# Patient Record
Sex: Female | Born: 1975
Health system: Southern US, Academic
[De-identification: ages and names within clinical notes are randomized; demographics above are authoritative.]

## PROBLEM LIST (undated history)

## (undated) DIAGNOSIS — Z227 Latent tuberculosis: Secondary | ICD-10-CM

## (undated) DIAGNOSIS — M1990 Unspecified osteoarthritis, unspecified site: Secondary | ICD-10-CM

## (undated) DIAGNOSIS — K8020 Calculus of gallbladder without cholecystitis without obstruction: Secondary | ICD-10-CM

## (undated) DIAGNOSIS — Z8489 Family history of other specified conditions: Secondary | ICD-10-CM

## (undated) DIAGNOSIS — D649 Anemia, unspecified: Secondary | ICD-10-CM

## (undated) DIAGNOSIS — R011 Cardiac murmur, unspecified: Secondary | ICD-10-CM

## (undated) DIAGNOSIS — K759 Inflammatory liver disease, unspecified: Secondary | ICD-10-CM

## (undated) HISTORY — PX: TUBAL LIGATION: SHX77

---

## 2016-02-09 ENCOUNTER — Other Ambulatory Visit: Payer: Self-pay | Admitting: Nurse Practitioner

## 2016-02-09 DIAGNOSIS — Z1231 Encounter for screening mammogram for malignant neoplasm of breast: Secondary | ICD-10-CM

## 2016-02-12 ENCOUNTER — Ambulatory Visit: Payer: Self-pay

## 2016-03-23 ENCOUNTER — Other Ambulatory Visit: Payer: Self-pay | Admitting: Gastroenterology

## 2016-03-23 DIAGNOSIS — B181 Chronic viral hepatitis B without delta-agent: Secondary | ICD-10-CM

## 2016-03-23 DIAGNOSIS — D696 Thrombocytopenia, unspecified: Secondary | ICD-10-CM

## 2016-04-04 ENCOUNTER — Ambulatory Visit
Admission: RE | Admit: 2016-04-04 | Discharge: 2016-04-04 | Disposition: A | Discharge status: Home / Self Care | Payer: Medicaid Other | Source: Ambulatory Visit | Attending: Gastroenterology | Admitting: Gastroenterology

## 2016-04-04 DIAGNOSIS — D696 Thrombocytopenia, unspecified: Secondary | ICD-10-CM

## 2016-04-04 DIAGNOSIS — B181 Chronic viral hepatitis B without delta-agent: Secondary | ICD-10-CM

## 2016-05-10 ENCOUNTER — Ambulatory Visit
Admission: RE | Admit: 2016-05-10 | Discharge: 2016-05-10 | Disposition: A | Discharge status: Home / Self Care | Payer: Commercial Managed Care - PPO | Source: Ambulatory Visit | Attending: Internal Medicine | Admitting: Internal Medicine

## 2016-05-10 ENCOUNTER — Encounter: Payer: Self-pay | Admitting: Internal Medicine

## 2016-05-10 ENCOUNTER — Other Ambulatory Visit: Payer: Self-pay | Admitting: Internal Medicine

## 2016-05-10 ENCOUNTER — Ambulatory Visit (INDEPENDENT_AMBULATORY_CARE_PROVIDER_SITE_OTHER): Payer: Commercial Managed Care - PPO | Admitting: Internal Medicine

## 2016-05-10 DIAGNOSIS — B181 Chronic viral hepatitis B without delta-agent: Secondary | ICD-10-CM

## 2016-05-10 DIAGNOSIS — Z227 Latent tuberculosis: Secondary | ICD-10-CM

## 2016-05-10 DIAGNOSIS — J302 Other seasonal allergic rhinitis: Secondary | ICD-10-CM | POA: Insufficient documentation

## 2016-05-10 DIAGNOSIS — D509 Iron deficiency anemia, unspecified: Secondary | ICD-10-CM | POA: Insufficient documentation

## 2016-05-10 DIAGNOSIS — J301 Allergic rhinitis due to pollen: Secondary | ICD-10-CM

## 2016-05-10 DIAGNOSIS — D696 Thrombocytopenia, unspecified: Secondary | ICD-10-CM

## 2016-05-10 DIAGNOSIS — R7611 Nonspecific reaction to tuberculin skin test without active tuberculosis: Secondary | ICD-10-CM

## 2016-05-10 LAB — COMPREHENSIVE METABOLIC PANEL
ALT: 7 U/L (ref 6–29)
AST: 15 U/L (ref 10–30)
Albumin: 4.2 g/dL (ref 3.6–5.1)
Alkaline Phosphatase: 78 U/L (ref 33–115)
BILIRUBIN TOTAL: 0.3 mg/dL (ref 0.2–1.2)
BUN: 12 mg/dL (ref 7–25)
CALCIUM: 9.1 mg/dL (ref 8.6–10.2)
CHLORIDE: 105 mmol/L (ref 98–110)
CO2: 26 mmol/L (ref 20–31)
Creat: 0.96 mg/dL (ref 0.50–1.10)
GLUCOSE: 78 mg/dL (ref 65–99)
POTASSIUM: 4.5 mmol/L (ref 3.5–5.3)
Sodium: 138 mmol/L (ref 135–146)
Total Protein: 7.1 g/dL (ref 6.1–8.1)

## 2016-05-10 MED ORDER — RIFAMPIN 300 MG PO CAPS: 600.0000 mg | ORAL_CAPSULE | Freq: Every day | ORAL | 3 refills | Status: DC

## 2016-05-10 NOTE — Assessment & Plan Note (Signed)
I will check hepatitis B serologies, fibrosure and hepatitis B DNA viral load to make sure Viread is suppressing her infection.

## 2016-05-10 NOTE — Assessment & Plan Note (Signed)
I talked to her about the lifetime benefit of taking rifampin to reduce her risk of developing active tuberculosis. I also reviewed potential side effects including elevation of liver enzymes, discoloration of urine and tears and potential drug drug interactions. After this discussion she is willing to start rifampin. I favor it over isoniazid in this case because of the shorter duration of therapy. I will get a repeat chest x-ray today, baseline liver enzymes and HIV antibody. She will follow-up in one month.

## 2016-05-10 NOTE — Progress Notes (Signed)
[facility]  Reason for Consult: Latent tuberculosis and chronic hepatitis B Referring Physician: Dr. Kathi DerParag Brahmbhatt  Patient Active Problem List   Diagnosis Date Noted  . Chronic hepatitis B without delta agent without cirrhosis (HCC) 05/10/2016    Priority: High  . Latent tuberculosis by blood test 05/10/2016    Priority: High  . Seasonal allergies 05/10/2016  . Microcytic anemia 05/10/2016  . Thrombocytopenia (HCC) 05/10/2016    Patient's Medications  New Prescriptions   RIFAMPIN (RIFADIN) 300 MG CAPSULE    Take 2 capsules (600 mg total) by mouth daily.  Previous Medications   FERROUS SULFATE 325 (65 FE) MG EC TABLET    Take 325 mg by mouth 3 (three) times daily with meals.   TENOFOVIR (VIREAD) 300 MG TABLET    Take 300 mg by mouth daily.  Modified Medications   No medications on file  Discontinued Medications   No medications on file    Recommendations: 1. Chest x-ray 2. CMP, hepatitis B surface antigen, surface antibody, E antigen, E antibody, DNA viral load, fibrosure and HIV antibody 3. Start rifampin 600 mg daily for 4 months 4. Follow-up in 4 weeks  Assessment: I talked to her about the lifetime benefit of taking rifampin to reduce her risk of developing active tuberculosis. I also reviewed potential side effects including elevation of liver enzymes, discoloration of urine and tears and potential drug drug interactions. After this discussion she is willing to start rifampin. I favor it over isoniazid in this case because of the shorter duration of therapy. I will get a repeat chest x-ray today, baseline liver enzymes and HIV antibody. She will follow-up in one month.  I will check hepatitis B serologies, fibrosure and hepatitis B DNA viral load to make sure Viread is suppressing her infection.   HPI: Christine Mccarty is a 40 y.o. female was originally from [place]. While living in [place] about 8 years ago she developed  thrombocytopenia and elevated liver enzymes and was found to have chronic hepatitis B. She has been on Viread ever since she believes her infection has been under good control. In 2014 she was found to have latent tuberculosis with a QuantiFERON TB gold assay. She recalls being told that her chest x-ray was normal at that time. Her doctor [place] recommended rifampin but she never started it because she was concerned about liver inflammation. She says that she is willing to take rifampin if I recommend it.  Review of Systems: Review of Systems  Constitutional: Negative for chills, diaphoresis, fever, malaise/fatigue and weight loss.  HENT: Negative for sore throat.   Respiratory: Negative for cough, hemoptysis, sputum production and shortness of breath.   Cardiovascular: Negative for chest pain.  Gastrointestinal: Negative for abdominal pain, diarrhea, heartburn, nausea and vomiting.  Genitourinary: Negative for dysuria and frequency.  Musculoskeletal: Positive for joint pain. Negative for myalgias.  Skin: Negative for rash.  Neurological: Negative for dizziness and headaches.  Psychiatric/Behavioral: Negative for depression and substance abuse. The patient is not nervous/anxious.       No past medical history on file.  Social History  Substance Use Topics  . Smoking status: Never Smoker  . Smokeless tobacco: Never Used  . Alcohol use 0.6 oz/week    1 Standard drinks or equivalent per week     Comment: rare social drinker    No family history on file. Not on File  OBJECTIVE: Vitals:   05/10/16  0916  BP: 121/77  Pulse: 65  Temp: 98.1 F (36.7 C)  TempSrc: Oral  Weight: 199 lb 4 oz (90.4 kg)  Height: 5' 7.5" (1.715 m)   Body mass index is 30.75 kg/m.   Physical Exam  Constitutional: She is oriented to person, place, and time.  She is in good spirits and in no distress.  HENT:  Mouth/Throat: No oropharyngeal exudate.  Eyes: Conjunctivae are normal.    Cardiovascular: Normal rate and regular rhythm.   No murmur heard. Pulmonary/Chest: Effort normal and breath sounds normal. She has no wheezes. She has no rales.  Abdominal: Soft. She exhibits no mass. There is no tenderness.  Musculoskeletal: Normal range of motion.  Neurological: She is alert and oriented to person, place, and time. Gait normal.  Skin: No rash noted.  Psychiatric: Mood and affect normal.    Microbiology: No results found for this or any previous visit (from the past 240 hour(s)).  Cliffton AstersJohn Idona Stach, MD [facility] [phone] pager   [phone] cell 05/10/2016, 9:37 AM

## 2016-05-11 LAB — HEPATITIS C ANTIBODY: HCV AB: NEGATIVE

## 2016-05-11 LAB — HEPATITIS B SURF AG CONFIRMATION: HEPATITIS B SURFACE ANTIGEN CONFIRMATION: POSITIVE — AB

## 2016-05-11 LAB — HEPATITIS B SURFACE ANTIGEN: Hepatitis B Surface Ag: POSITIVE — AB

## 2016-05-11 LAB — HEPATITIS A ANTIBODY, TOTAL: HEP A TOTAL AB: REACTIVE — AB

## 2016-05-11 LAB — HEPATITIS B SURFACE ANTIBODY,QUALITATIVE: Hep B S Ab: NEGATIVE

## 2016-05-11 NOTE — Addendum Note (Signed)
Addended byJimmy Picket: ABBITT, KATRINA F on: 05/11/2016 03:17 PM   Modules accepted: Orders

## 2016-05-13 ENCOUNTER — Telehealth: Payer: Self-pay | Admitting: *Deleted

## 2016-05-13 LAB — HEPATITIS B E ANTIBODY: Hepatitis Be Antibody: REACTIVE — AB

## 2016-05-13 LAB — HEPATITIS B E ANTIGEN: HEPATITIS BE ANTIGEN: NONREACTIVE

## 2016-05-13 NOTE — Telephone Encounter (Signed)
Dr Orvan Falconerampbell requesting pt return for labwork-Hep B VL.  This order was added by Dr. Orvan Falconerampbell and the patient needs to return at her earliest convenience to have it drawn.  Asked the patient to call back on [phone] to make this  appointment.

## 2016-05-14 LAB — LIVER FIBROSIS, FIBROTEST-ACTITEST
ALPHA-2-MACROGLOBULIN: 232 mg/dL (ref 106–279)
ALT: 7 U/L (ref 6–29)
Apolipoprotein A1: 162 mg/dL (ref 101–198)
Bilirubin: 0.3 mg/dL (ref 0.2–1.2)
Fibrosis Score: 0.09
GGT: 15 U/L (ref 3–55)
Haptoglobin: 112 mg/dL (ref 43–212)
NECROINFLAMMAT ACT SCORE: 0.01
Reference ID: 1719671

## 2016-05-23 ENCOUNTER — Other Ambulatory Visit: Payer: Commercial Managed Care - PPO

## 2016-05-23 DIAGNOSIS — Z227 Latent tuberculosis: Secondary | ICD-10-CM

## 2016-05-25 LAB — HEPATITIS B DNA, ULTRAQUANTITATIVE, PCR

## 2016-05-31 NOTE — Progress Notes (Signed)
Patient does not require isolation precautions related to diagnosis of latent TB. Fredric DineMargaret Kendrea Cerritos RN BSN Infection Prevention

## 2016-06-01 ENCOUNTER — Encounter (HOSPITAL_COMMUNITY): Payer: Self-pay | Admitting: *Deleted

## 2016-06-01 ENCOUNTER — Ambulatory Visit: Payer: Self-pay | Admitting: General Surgery

## 2016-06-01 NOTE — Progress Notes (Signed)
Pt denies SOB, chest pain, and being under the care of a cardiologist. Pt stated that an echo was performed >10 years ago when it was detected that she had a mild heart murmur. Pt denies having a stress test and cardiac cath. Pt last labs were done 05/10/16. Pt made aware to stop taking Aspirin,vitamins, fish oil, and herbal medications. Do not take any NSAIDs ie: Ibuprofen, Advil, Naproxen, BC and Goody Powder or any medication containing Aspirin. Pt verbalized understanding of all pre-op instructions.

## 2016-06-02 ENCOUNTER — Ambulatory Visit: Payer: Self-pay | Admitting: General Surgery

## 2016-06-02 ENCOUNTER — Ambulatory Visit (HOSPITAL_COMMUNITY): Payer: Commercial Managed Care - PPO | Admitting: Anesthesiology

## 2016-06-02 ENCOUNTER — Encounter (HOSPITAL_COMMUNITY): Payer: Self-pay | Admitting: *Deleted

## 2016-06-02 ENCOUNTER — Ambulatory Visit (HOSPITAL_COMMUNITY)
Admission: RE | Admit: 2016-06-02 | Discharge: 2016-06-02 | Disposition: A | Discharge status: Home / Self Care | Payer: Commercial Managed Care - PPO | Source: Ambulatory Visit | Attending: General Surgery | Admitting: General Surgery

## 2016-06-02 ENCOUNTER — Encounter (HOSPITAL_COMMUNITY)
Admission: RE | Disposition: A | Discharge status: Home / Self Care | Payer: Self-pay | Source: Ambulatory Visit | Attending: General Surgery

## 2016-06-02 DIAGNOSIS — K824 Cholesterolosis of gallbladder: Secondary | ICD-10-CM | POA: Diagnosis present

## 2016-06-02 DIAGNOSIS — E7800 Pure hypercholesterolemia, unspecified: Secondary | ICD-10-CM | POA: Insufficient documentation

## 2016-06-02 DIAGNOSIS — B1910 Unspecified viral hepatitis B without hepatic coma: Secondary | ICD-10-CM | POA: Insufficient documentation

## 2016-06-02 DIAGNOSIS — K8010 Calculus of gallbladder with chronic cholecystitis without obstruction: Secondary | ICD-10-CM | POA: Insufficient documentation

## 2016-06-02 DIAGNOSIS — Z79899 Other long term (current) drug therapy: Secondary | ICD-10-CM | POA: Diagnosis not present

## 2016-06-02 HISTORY — DX: Cardiac murmur, unspecified: R01.1

## 2016-06-02 HISTORY — DX: Calculus of gallbladder without cholecystitis without obstruction: K80.20

## 2016-06-02 HISTORY — DX: Latent tuberculosis: Z22.7

## 2016-06-02 HISTORY — DX: Inflammatory liver disease, unspecified: K75.9

## 2016-06-02 HISTORY — DX: Family history of other specified conditions: Z84.89

## 2016-06-02 HISTORY — PX: CHOLECYSTECTOMY: SHX55

## 2016-06-02 HISTORY — DX: Unspecified osteoarthritis, unspecified site: M19.90

## 2016-06-02 HISTORY — DX: Anemia, unspecified: D64.9

## 2016-06-02 LAB — CBC
HCT: 37.4 % (ref 36.0–46.0)
HEMOGLOBIN: 11.9 g/dL — AB (ref 12.0–15.0)
MCH: 26.4 pg (ref 26.0–34.0)
MCHC: 31.8 g/dL (ref 30.0–36.0)
MCV: 82.9 fL (ref 78.0–100.0)
Platelets: 108 10*3/uL — ABNORMAL LOW (ref 150–400)
RBC: 4.51 MIL/uL (ref 3.87–5.11)
RDW: 16.4 % — ABNORMAL HIGH (ref 11.5–15.5)
WBC: 3.8 10*3/uL — ABNORMAL LOW (ref 4.0–10.5)

## 2016-06-02 LAB — COMPREHENSIVE METABOLIC PANEL
ALBUMIN: 4 g/dL (ref 3.5–5.0)
ALK PHOS: 90 U/L (ref 38–126)
ALT: 10 U/L — AB (ref 14–54)
ANION GAP: 4 — AB (ref 5–15)
AST: 18 U/L (ref 15–41)
BUN: 6 mg/dL (ref 6–20)
CALCIUM: 9.1 mg/dL (ref 8.9–10.3)
CHLORIDE: 108 mmol/L (ref 101–111)
CO2: 26 mmol/L (ref 22–32)
Creatinine, Ser: 0.96 mg/dL (ref 0.44–1.00)
GFR calc non Af Amer: >60 mL/min (ref >60)
GLUCOSE: 86 mg/dL (ref 65–99)
Potassium: 4.3 mmol/L (ref 3.5–5.1)
SODIUM: 138 mmol/L (ref 135–145)
Total Bilirubin: 0.3 mg/dL (ref 0.3–1.2)
Total Protein: 6.9 g/dL (ref 6.5–8.1)

## 2016-06-02 LAB — HCG, SERUM, QUALITATIVE: Preg, Serum: NEGATIVE

## 2016-06-02 SURGERY — LAPAROSCOPIC CHOLECYSTECTOMY
Anesthesia: General | Site: Abdomen

## 2016-06-02 MED ORDER — DIPHENHYDRAMINE HCL 50 MG/ML IJ SOLN
12.5000 mg | Freq: Once | INTRAMUSCULAR | Status: AC
  Administered 2016-06-02: 12.5 mg via INTRAVENOUS

## 2016-06-02 MED ORDER — GLYCOPYRROLATE 0.2 MG/ML IJ SOLN
INTRAMUSCULAR | Status: DC | PRN
  Administered 2016-06-02: 0.6 mg via INTRAVENOUS

## 2016-06-02 MED ORDER — DEXAMETHASONE SODIUM PHOSPHATE 10 MG/ML IJ SOLN
INTRAMUSCULAR | Status: AC
  Filled 2016-06-02: qty 1

## 2016-06-02 MED ORDER — NEOSTIGMINE METHYLSULFATE 5 MG/5ML IV SOSY
PREFILLED_SYRINGE | INTRAVENOUS | Status: AC
  Filled 2016-06-02: qty 5

## 2016-06-02 MED ORDER — SODIUM CHLORIDE 0.9 % IV SOLN: 250.0000 mL | INTRAVENOUS | Status: DC | PRN

## 2016-06-02 MED ORDER — CHLORHEXIDINE GLUCONATE CLOTH 2 % EX PADS: 6.0000 | MEDICATED_PAD | Freq: Once | CUTANEOUS | Status: DC

## 2016-06-02 MED ORDER — DIPHENHYDRAMINE HCL 50 MG/ML IJ SOLN
INTRAMUSCULAR | Status: AC
  Filled 2016-06-02: qty 1

## 2016-06-02 MED ORDER — CEFAZOLIN SODIUM-DEXTROSE 2-4 GM/100ML-% IV SOLN
2.0000 g | INTRAVENOUS | Status: AC
  Administered 2016-06-02: 2 g via INTRAVENOUS
  Filled 2016-06-02: qty 100

## 2016-06-02 MED ORDER — ROCURONIUM BROMIDE 50 MG/5ML IV SOSY
PREFILLED_SYRINGE | INTRAVENOUS | Status: AC
  Filled 2016-06-02: qty 5

## 2016-06-02 MED ORDER — LIDOCAINE HCL (CARDIAC) 20 MG/ML IV SOLN
INTRAVENOUS | Status: DC | PRN
  Administered 2016-06-02: 60 mg via INTRAVENOUS

## 2016-06-02 MED ORDER — BUPIVACAINE-EPINEPHRINE (PF) 0.25% -1:200000 IJ SOLN
INTRAMUSCULAR | Status: DC | PRN
  Administered 2016-06-02: 30 mL via PERINEURAL

## 2016-06-02 MED ORDER — FENTANYL CITRATE (PF) 100 MCG/2ML IJ SOLN
25.0000 ug | INTRAMUSCULAR | Status: DC | PRN
  Administered 2016-06-02: 50 ug via INTRAVENOUS

## 2016-06-02 MED ORDER — ONDANSETRON HCL 4 MG/2ML IJ SOLN: 4.0000 mg | Freq: Once | INTRAMUSCULAR | Status: DC | PRN

## 2016-06-02 MED ORDER — MIDAZOLAM HCL 5 MG/5ML IJ SOLN
INTRAMUSCULAR | Status: DC | PRN
  Administered 2016-06-02: 2 mg via INTRAVENOUS

## 2016-06-02 MED ORDER — ONDANSETRON HCL 4 MG/2ML IJ SOLN
INTRAMUSCULAR | Status: DC | PRN
  Administered 2016-06-02: 4 mg via INTRAVENOUS

## 2016-06-02 MED ORDER — SODIUM CHLORIDE 0.9% FLUSH: 3.0000 mL | INTRAVENOUS | Status: DC | PRN

## 2016-06-02 MED ORDER — OXYCODONE-ACETAMINOPHEN 5-325 MG PO TABS: 1.0000 | ORAL_TABLET | ORAL | 0 refills | Status: DC | PRN

## 2016-06-02 MED ORDER — PROPOFOL 10 MG/ML IV BOLUS
INTRAVENOUS | Status: DC | PRN
  Administered 2016-06-02: 140 mg via INTRAVENOUS
  Administered 2016-06-02: 20 mg via INTRAVENOUS

## 2016-06-02 MED ORDER — NEOSTIGMINE METHYLSULFATE 10 MG/10ML IV SOLN
INTRAVENOUS | Status: DC | PRN
  Administered 2016-06-02: 4 mg via INTRAVENOUS

## 2016-06-02 MED ORDER — LACTATED RINGERS IV SOLN
INTRAVENOUS | Status: DC
  Administered 2016-06-02 (×2): via INTRAVENOUS

## 2016-06-02 MED ORDER — FENTANYL CITRATE (PF) 250 MCG/5ML IJ SOLN
INTRAMUSCULAR | Status: AC
  Filled 2016-06-02: qty 5

## 2016-06-02 MED ORDER — SODIUM CHLORIDE 0.9% FLUSH: 3.0000 mL | Freq: Two times a day (BID) | INTRAVENOUS | Status: DC

## 2016-06-02 MED ORDER — ROCURONIUM BROMIDE 100 MG/10ML IV SOLN
INTRAVENOUS | Status: DC | PRN
  Administered 2016-06-02: 40 mg via INTRAVENOUS

## 2016-06-02 MED ORDER — PROPOFOL 10 MG/ML IV BOLUS
INTRAVENOUS | Status: AC
  Filled 2016-06-02: qty 20

## 2016-06-02 MED ORDER — FENTANYL CITRATE (PF) 100 MCG/2ML IJ SOLN
INTRAMUSCULAR | Status: DC | PRN
  Administered 2016-06-02: 100 ug via INTRAVENOUS
  Administered 2016-06-02: 50 ug via INTRAVENOUS
  Administered 2016-06-02: 100 ug via INTRAVENOUS

## 2016-06-02 MED ORDER — OXYCODONE HCL 5 MG PO TABS: 5.0000 mg | ORAL_TABLET | Freq: Once | ORAL | Status: DC | PRN

## 2016-06-02 MED ORDER — MORPHINE SULFATE (PF) 4 MG/ML IV SOLN
INTRAVENOUS | Status: AC
  Filled 2016-06-02: qty 1

## 2016-06-02 MED ORDER — MORPHINE SULFATE (PF) 2 MG/ML IV SOLN
2.0000 mg | INTRAVENOUS | Status: DC | PRN
  Administered 2016-06-02: 2 mg via INTRAVENOUS

## 2016-06-02 MED ORDER — DEXAMETHASONE SODIUM PHOSPHATE 10 MG/ML IJ SOLN
INTRAMUSCULAR | Status: DC | PRN
  Administered 2016-06-02: 10 mg via INTRAVENOUS

## 2016-06-02 MED ORDER — FENTANYL CITRATE (PF) 100 MCG/2ML IJ SOLN
INTRAMUSCULAR | Status: AC
  Administered 2016-06-02: 50 ug via INTRAVENOUS
  Filled 2016-06-02: qty 2

## 2016-06-02 MED ORDER — 0.9 % SODIUM CHLORIDE (POUR BTL) OPTIME
TOPICAL | Status: DC | PRN
  Administered 2016-06-02: 1000 mL

## 2016-06-02 MED ORDER — ONDANSETRON HCL 4 MG/2ML IJ SOLN
INTRAMUSCULAR | Status: AC
  Filled 2016-06-02: qty 4

## 2016-06-02 MED ORDER — OXYCODONE HCL 5 MG PO TABS
ORAL_TABLET | ORAL | Status: AC
  Administered 2016-06-02: 10 mg via ORAL
  Filled 2016-06-02: qty 2

## 2016-06-02 MED ORDER — ACETAMINOPHEN 650 MG RE SUPP: 650.0000 mg | RECTAL | Status: DC | PRN

## 2016-06-02 MED ORDER — OXYCODONE HCL 5 MG/5ML PO SOLN: 5.0000 mg | Freq: Once | ORAL | Status: DC | PRN

## 2016-06-02 MED ORDER — ACETAMINOPHEN 325 MG PO TABS
ORAL_TABLET | ORAL | Status: AC
  Administered 2016-06-02: 650 mg via ORAL
  Filled 2016-06-02: qty 2

## 2016-06-02 MED ORDER — SODIUM CHLORIDE 0.9 % IR SOLN
Status: DC | PRN
  Administered 2016-06-02: 1000 mL

## 2016-06-02 MED ORDER — ACETAMINOPHEN 325 MG PO TABS
650.0000 mg | ORAL_TABLET | ORAL | Status: DC | PRN
  Administered 2016-06-02: 650 mg via ORAL

## 2016-06-02 MED ORDER — BUPIVACAINE-EPINEPHRINE (PF) 0.25% -1:200000 IJ SOLN
INTRAMUSCULAR | Status: AC
  Filled 2016-06-02: qty 30

## 2016-06-02 MED ORDER — OXYCODONE HCL 5 MG PO TABS
5.0000 mg | ORAL_TABLET | ORAL | Status: DC | PRN
  Administered 2016-06-02: 10 mg via ORAL

## 2016-06-02 MED ORDER — LIDOCAINE 2% (20 MG/ML) 5 ML SYRINGE
INTRAMUSCULAR | Status: AC
  Filled 2016-06-02: qty 5

## 2016-06-02 MED ORDER — MIDAZOLAM HCL 2 MG/2ML IJ SOLN
INTRAMUSCULAR | Status: AC
  Filled 2016-06-02: qty 2

## 2016-06-02 SURGICAL SUPPLY — 39 items
BENZOIN TINCTURE PRP APPL 2/3 (GAUZE/BANDAGES/DRESSINGS) ×3 IMPLANT
CANISTER SUCTION 2500CC (MISCELLANEOUS) ×3 IMPLANT
CHLORAPREP W/TINT 26ML (MISCELLANEOUS) ×3 IMPLANT
CLIP LIGATING HEMO O LOK GREEN (MISCELLANEOUS) ×3 IMPLANT
CLOSURE STERI-STRIP 1/2X4 (GAUZE/BANDAGES/DRESSINGS) ×1
CLOSURE WOUND 1/2 X4 (GAUZE/BANDAGES/DRESSINGS) ×1
CLSR STERI-STRIP ANTIMIC 1/2X4 (GAUZE/BANDAGES/DRESSINGS) ×2 IMPLANT
COVER SURGICAL LIGHT HANDLE (MISCELLANEOUS) ×3 IMPLANT
COVER TRANSDUCER ULTRASND (DRAPES) ×3 IMPLANT
DEVICE TROCAR PUNCTURE CLOSURE (ENDOMECHANICALS) ×3 IMPLANT
ELECT REM PT RETURN 9FT ADLT (ELECTROSURGICAL) ×3
ELECTRODE REM PT RTRN 9FT ADLT (ELECTROSURGICAL) ×1 IMPLANT
GAUZE SPONGE 2X2 8PLY STRL LF (GAUZE/BANDAGES/DRESSINGS) ×1 IMPLANT
GLOVE BIO SURGEON STRL SZ7.5 (GLOVE) ×3 IMPLANT
GOWN STRL REUS W/ TWL LRG LVL3 (GOWN DISPOSABLE) ×2 IMPLANT
GOWN STRL REUS W/ TWL XL LVL3 (GOWN DISPOSABLE) ×1 IMPLANT
GOWN STRL REUS W/TWL LRG LVL3 (GOWN DISPOSABLE) ×4
GOWN STRL REUS W/TWL XL LVL3 (GOWN DISPOSABLE) ×2
KIT BASIN OR (CUSTOM PROCEDURE TRAY) ×3 IMPLANT
KIT ROOM TURNOVER OR (KITS) ×3 IMPLANT
NEEDLE INSUFFLATION 14GA 120MM (NEEDLE) ×3 IMPLANT
NS IRRIG 1000ML POUR BTL (IV SOLUTION) ×3 IMPLANT
PAD ARMBOARD 7.5X6 YLW CONV (MISCELLANEOUS) ×6 IMPLANT
POUCH RETRIEVAL ECOSAC 10 (ENDOMECHANICALS) IMPLANT
POUCH RETRIEVAL ECOSAC 10MM (ENDOMECHANICALS)
SCISSORS LAP 5X35 DISP (ENDOMECHANICALS) ×3 IMPLANT
SET IRRIG TUBING LAPAROSCOPIC (IRRIGATION / IRRIGATOR) ×3 IMPLANT
SLEEVE ENDOPATH XCEL 5M (ENDOMECHANICALS) ×6 IMPLANT
SPECIMEN JAR SMALL (MISCELLANEOUS) ×3 IMPLANT
SPONGE GAUZE 2X2 STER 10/PKG (GAUZE/BANDAGES/DRESSINGS) ×2
STRIP CLOSURE SKIN 1/2X4 (GAUZE/BANDAGES/DRESSINGS) ×2 IMPLANT
SUT MNCRL AB 3-0 PS2 18 (SUTURE) ×3 IMPLANT
TAPE CLOTH SURG 4X10 WHT LF (GAUZE/BANDAGES/DRESSINGS) ×3 IMPLANT
TOWEL OR 17X24 6PK STRL BLUE (TOWEL DISPOSABLE) ×3 IMPLANT
TOWEL OR 17X26 10 PK STRL BLUE (TOWEL DISPOSABLE) ×3 IMPLANT
TRAY LAPAROSCOPIC MC (CUSTOM PROCEDURE TRAY) ×3 IMPLANT
TROCAR XCEL NON-BLD 11X100MML (ENDOMECHANICALS) ×3 IMPLANT
TROCAR XCEL NON-BLD 5MMX100MML (ENDOMECHANICALS) ×3 IMPLANT
TUBING INSUFFLATION (TUBING) ×3 IMPLANT

## 2016-06-02 NOTE — H&P (Signed)
History of Present Illness Patient words: GB.  The patient is a 40 year old female who presents for evaluation of gall stones. The patient is a 40 year old female who is referred by Dr. Kathi DerParag Brahmbhatt for evaluation of gallstones and a gallbladder polyp. Patient has a history of hepatitis B who is reestablishing care here after moving from [place]. Patient states that she is currently on medication and has an undetectable level of hepatitis B virus. Patient states that she underwent surveillance ultrasound which revealed multiple gallstones as well as a 7 mm gallbladder polyp. Patient was referred thereafter for cholecystectomy.   Other Problems  Arthritis  Cholelithiasis  Heart murmur  Hepatitis  Hypercholesterolemia   Diagnostic Studies History  Colonoscopy  never Mammogram  never Pap Smear  1-5 years ago  Allergies NSAIDs   Medication History  Iron (Ferrous Gluconate) (325MG  Tablet, Oral) Active. Viread (150MG  Tablet, Oral) Active. Medications Reconciled  Social History  Alcohol use  Occasional alcohol use. Caffeine use  Carbonated beverages, Coffee, Tea. No drug use  Tobacco use  Never smoker.  Family History  Diabetes Mellitus  Mother. Hypertension  Mother.  Pregnancy / Birth History Age at menarche  10 years. Gravida  5 Length (months) of breastfeeding  7-12 Maternal age  42-25 Para  4 Regular periods     Review of Systems  General Not Present- Appetite Loss, Chills, Fatigue, Fever, Night Sweats, Weight Gain and Weight Loss. Skin Not Present- Change in Wart/Mole, Dryness, Hives, Jaundice, New Lesions, Non-Healing Wounds, Rash and Ulcer. HEENT Present- Seasonal Allergies and Wears glasses/contact lenses. Not Present- Earache, Hearing Loss, Hoarseness, Nose Bleed, Oral Ulcers, Ringing in the Ears, Sinus Pain, Sore Throat, Visual Disturbances and Yellow Eyes. Respiratory Present- Snoring. Not Present- Bloody sputum, Chronic Cough,  Difficulty Breathing and Wheezing. Breast Not Present- Breast Mass, Breast Pain, Nipple Discharge and Skin Changes. Cardiovascular Not Present- Chest Pain, Difficulty Breathing Lying Down, Leg Cramps, Palpitations, Rapid Heart Rate, Shortness of Breath and Swelling of Extremities. Gastrointestinal Not Present- Abdominal Pain, Bloating, Bloody Stool, Change in Bowel Habits, Chronic diarrhea, Constipation, Difficulty Swallowing, Excessive gas, Gets full quickly at meals, Hemorrhoids, Indigestion, Nausea, Rectal Pain and Vomiting. Female Genitourinary Not Present- Frequency, Nocturia, Painful Urination, Pelvic Pain and Urgency. Musculoskeletal Not Present- Back Pain, Joint Pain, Joint Stiffness, Muscle Pain, Muscle Weakness and Swelling of Extremities. Neurological Not Present- Decreased Memory, Fainting, Headaches, Numbness, Seizures, Tingling, Tremor, Trouble walking and Weakness. Psychiatric Not Present- Anxiety, Bipolar, Change in Sleep Pattern, Depression, Fearful and Frequent crying. Endocrine Not Present- Cold Intolerance, Excessive Hunger, Hair Changes, Heat Intolerance, Hot flashes and New Diabetes. Hematology Not Present- Blood Thinners, Easy Bruising, Excessive bleeding, Gland problems, HIV and Persistent Infections.    Physical Exam  General Mental Status-Alert. General Appearance-Consistent with stated age. Hydration-Well hydrated. Voice-Normal.  Head and Neck Head-normocephalic, atraumatic with no lesions or palpable masses.  Eye Eyeball - Bilateral-Extraocular movements intact. Sclera/Conjunctiva - Bilateral-No scleral icterus.  Chest and Lung Exam Chest and lung exam reveals -quiet, even and easy respiratory effort with no use of accessory muscles. Inspection Chest Wall - Normal. Back - normal.  Cardiovascular Cardiovascular examination reveals -normal heart sounds, regular rate and rhythm with no murmurs.  Abdomen Inspection Normal Exam - No  Hernias. Palpation/Percussion Normal exam - Soft, Non Tender, No Rebound tenderness, No Rigidity (guarding) and No hepatosplenomegaly. Auscultation Normal exam - Bowel sounds normal.  Neurologic Neurologic evaluation reveals -alert and oriented x 3 with no impairment of recent or remote memory. Mental Status-Normal.  Musculoskeletal Normal Exam - Left-Upper Extremity Strength Normal and Lower Extremity Strength Normal. Normal Exam - Right-Upper Extremity Strength Normal, Lower Extremity Weakness.    Assessment & PlanGALLBLADDER POLYP (K82.4) Impression: 40 year old female with a history of hepatitis C, gallbladder wall polyp, gallstones.  1. We will proceed to the operating room for a laparoscopic cholecystectomy 2. Risks and benefits were discussed with the patient to generally include, but not limited to: infection, bleeding, possible need for post op ERCP, damage to the bile ducts, bile leak, and possible need for further surgery. Alternatives were offered and described. All questions were answered and the patient voiced understanding of the procedure and wishes to proceed at this point with a laparoscopic cholecystectomy

## 2016-06-02 NOTE — Anesthesia Preprocedure Evaluation (Signed)
Anesthesia Evaluation  Patient identified by MRN, date of birth, ID band Patient awake    Reviewed: Allergy & Precautions, NPO status , Patient's Chart, lab work & pertinent test results  Airway Mallampati: II  TM Distance: >3 FB Neck ROM: Full    Dental  (+) Poor Dentition, Dental Advisory Given   Pulmonary    breath sounds clear to auscultation       Cardiovascular  Rhythm:Regular Rate:Normal     Neuro/Psych    GI/Hepatic   Endo/Other    Renal/GU      Musculoskeletal   Abdominal   Peds  Hematology   Anesthesia Other Findings   Reproductive/Obstetrics                             Anesthesia Physical Anesthesia Plan  ASA: II  Anesthesia Plan: General   Post-op Pain Management:    Induction: Intravenous  Airway Management Planned: Oral ETT  Additional Equipment:   Intra-op Plan:   Post-operative Plan:   Informed Consent: I have reviewed the patients History and Physical, chart, labs and discussed the procedure including the risks, benefits and alternatives for the proposed anesthesia with the patient or authorized representative who has indicated his/her understanding and acceptance.   Dental advisory given  Plan Discussed with: CRNA and Anesthesiologist  Anesthesia Plan Comments:         Anesthesia Quick Evaluation

## 2016-06-02 NOTE — Discharge Instructions (Signed)
[facility] ______[facility] °LAPAROSCOPIC SURGERY: POST OP INSTRUCTIONS °Always review your discharge instruction sheet given to you by the facility where your surgery was performed. °IF YOU HAVE DISABILITY OR FAMILY LEAVE FORMS, YOU MUST BRING THEM TO THE OFFICE FOR PROCESSING.   °DO NOT GIVE THEM TO YOUR DOCTOR. ° °1. A prescription for pain medication may be given to you upon discharge.  Take your pain medication as prescribed, if needed.  If narcotic pain medicine is not needed, then you may take acetaminophen (Tylenol) or ibuprofen (Advil) as needed. °2. Take your usually prescribed medications unless otherwise directed. °3. If you need a refill on your pain medication, please contact your pharmacy.  They will contact our office to request authorization. Prescriptions will not be filled after 5pm or on week-ends. °4. You should follow a light diet the first few days after arrival home, such as soup and crackers, etc.  Be sure to include lots of fluids daily. °5. Most patients will experience some swelling and bruising in the area of the incisions.  Ice packs will help.  Swelling and bruising can take several days to resolve.  °6. It is common to experience some constipation if taking pain medication after surgery.  Increasing fluid intake and taking a stool softener (such as Colace) will usually help or prevent this problem from occurring.  A mild laxative (Milk of Magnesia or Miralax) should be taken according to package instructions if there are no bowel movements after 48 hours. °7. Unless discharge instructions indicate otherwise, you may remove your bandages 24-48 hours after surgery, and you may shower at that time.  You may have steri-strips (small skin tapes) in place directly over the incision.  These strips should be left on the skin for 7-10 days.  If your surgeon used skin glue on the incision, you may shower in 24 hours.  The glue will flake off over the next 2-3 weeks.  Any sutures or  staples will be removed at the office during your follow-up visit. °8. ACTIVITIES:  You may resume regular (light) daily activities beginning the next day--such as daily self-care, walking, climbing stairs--gradually increasing activities as tolerated.  You may have sexual intercourse when it is comfortable.  Refrain from any heavy lifting or straining until approved by your doctor. °a. You may drive when you are no longer taking prescription pain medication, you can comfortably wear a seatbelt, and you can safely maneuver your car and apply brakes. °b. RETURN TO WORK:  __________________________________________________________ °9. You should see your doctor in the office for a follow-up appointment approximately 2-3 weeks after your surgery.  Make sure that you call for this appointment within a day or two after you arrive home to insure a convenient appointment time. °10. OTHER INSTRUCTIONS: __________________________________________________________________________________________________________________________ __________________________________________________________________________________________________________________________ °WHEN TO CALL YOUR DOCTOR: °1. Fever over 101.0 °2. Inability to urinate °3. Continued bleeding from incision. °4. Increased pain, redness, or drainage from the incision. °5. Increasing abdominal pain ° °The clinic staff is available to answer your questions during regular business hours.  Please don’t hesitate to call and ask to speak to one of the nurses for clinical concerns.  If you have a medical emergency, go to the nearest emergency room or call 911.  A surgeon from [facility] is always on call at the hospital. °[address], [address], [place], [place]  [address] ? [address] [address], [place], [place]   [address] °[phone] ? [phone] ? FAX [phone] °Web site:   [email or web address] °

## 2016-06-02 NOTE — Transfer of Care (Signed)
Immediate Anesthesia Transfer of Care Note  Patient: Christine Mccarty  Procedure(s) Performed: Procedure(s): LAPAROSCOPIC CHOLECYSTECTOMY (N/A)  Patient Location: PACU  Anesthesia Type:General  Level of Consciousness: awake, alert , oriented, patient cooperative and responds to stimulation  Airway & Oxygen Therapy: Patient Spontanous Breathing and Patient connected to nasal cannula oxygen  Post-op Assessment: Report given to RN, Post -op Vital signs reviewed and stable and Patient moving all extremities X 4  Post vital signs: Reviewed and stable  Last Vitals:  Vitals:   06/02/16 0949 06/02/16 1305  BP: 131/67 (P) 132/61  Pulse: 71 (P) 75  Resp: 18   Temp: 36.7 C     Last Pain:  Vitals:   06/02/16 0949  TempSrc: Oral      Patients Stated Pain Goal: 8 (06/02/16 1008)  Complications: No apparent anesthesia complications

## 2016-06-02 NOTE — Op Note (Signed)
06/02/2016  12:51 PM  PATIENT:  Christine Mccarty  40 y.o. female  PRE-OPERATIVE DIAGNOSIS:  gallbladder polyp  POST-OPERATIVE DIAGNOSIS:  same  PROCEDURE:  Procedure(s): LAPAROSCOPIC CHOLECYSTECTOMY (N/A)  SURGEON:  Surgeon(s) and Role:    * Axel FillerArmando Knox Cervi, MD - Primary  ANESTHESIA:   local and general  EBL:  No intake/output data recorded.  BLOOD ADMINISTERED:none  DRAINS: none   LOCAL MEDICATIONS USED:  BUPIVICAINE   SPECIMEN:  Source of Specimen:  gallbladder  DISPOSITION OF SPECIMEN:  PATHOLOGY  COUNTS:  YES  TOURNIQUET:  * No tourniquets in log *  DICTATION: .Dragon Dictation The patient was taken to the operating and placed in the supine position with bilateral SCDs in place. The patient was prepped and draped in the usual sterile fashion. A time out was called and all facts were verified. A pneumoperitoneum was obtained via A Veress needle technique to a pressure of 14mm of mercury.  A 5mm trochar was then placed in the right upper quadrant under visualization, and there were no injuries to any abdominal organs. A 11 mm port was then placed in the umbilical region after infiltrating with local anesthesia under direct visualization. A second and third epigastric port and right lower quadrant port placement under direct visualization, respectively. The gallbladder was identified and retracted, the peritoneum was then sharply dissected from the gallbladder and this dissection was carried down to Calot's triangle. The gallbladder was identified and stripped away circumferentially and seen going into the gallbladder 360, the critical angle was obtained.  2 clips were placed proximally one distally and the cystic duct transected. The cystic artery was identified and 2 clips placed proximally and one distally and transected. We then proceeded to remove the gallbladder off the hepatic fossa with Bovie cautery. A retrieval bag was then placed in the abdomen and gallbladder placed  in the bag. The hepatic fossa was then reexamined and hemostasis was achieved with Bovie cautery and was excellent at the end of the case. The subhepatic fossa and perihepatic fossa was then irrigated until the effluent was clear.  The gallbladder and bag were removed from the abdominal cavity. The 11 mm trocar fascia was reapproximated with the Endo Close #1 Vicryl. The pneumoperitoneum was evacuated and all trochars removed under direct visulalization. The skin was then closed with 4-0 Monocryl and the skin dressed with Steri-Strips, gauze, and tape. The patient was awaken from general anesthesia and taken to the recovery room in stable condition.   PLAN OF CARE: Discharge to home after PACU  PATIENT DISPOSITION:  PACU - hemodynamically stable.   Delay start of Pharmacological VTE agent (>24hrs) due to surgical blood loss or risk of bleeding: not applicable

## 2016-06-02 NOTE — Anesthesia Postprocedure Evaluation (Signed)
Anesthesia Post Note  Patient: Christine Mccarty  Procedure(s) Performed: Procedure(s) (LRB): LAPAROSCOPIC CHOLECYSTECTOMY (N/A)  Patient location during evaluation: PACU Anesthesia Type: General Level of consciousness: awake and alert Pain management: pain level controlled Vital Signs Assessment: post-procedure vital signs reviewed and stable Respiratory status: spontaneous breathing, nonlabored ventilation, respiratory function stable and patient connected to nasal cannula oxygen Cardiovascular status: blood pressure returned to baseline and stable Postop Assessment: no signs of nausea or vomiting Anesthetic complications: no       Last Vitals:  Vitals:   06/02/16 1430 06/02/16 1445  BP: (!) 143/82   Pulse: (!) 57 (!) 56  Resp: 11 11  Temp:      Last Pain:  Vitals:   06/02/16 1330  TempSrc:   PainSc: 7                  Shakora Nordquist

## 2016-06-03 ENCOUNTER — Encounter (HOSPITAL_COMMUNITY): Payer: Self-pay | Admitting: General Surgery

## 2016-06-21 ENCOUNTER — Ambulatory Visit: Payer: Commercial Managed Care - PPO | Admitting: Internal Medicine

## 2016-06-24 ENCOUNTER — Other Ambulatory Visit: Payer: Self-pay | Admitting: Internal Medicine

## 2016-06-24 DIAGNOSIS — Z227 Latent tuberculosis: Secondary | ICD-10-CM

## 2016-07-14 ENCOUNTER — Encounter: Payer: Self-pay | Admitting: Internal Medicine

## 2016-07-14 ENCOUNTER — Ambulatory Visit (INDEPENDENT_AMBULATORY_CARE_PROVIDER_SITE_OTHER): Payer: Commercial Managed Care - PPO | Admitting: Internal Medicine

## 2016-07-14 ENCOUNTER — Other Ambulatory Visit: Payer: Self-pay | Admitting: Internal Medicine

## 2016-07-14 DIAGNOSIS — R7611 Nonspecific reaction to tuberculin skin test without active tuberculosis: Secondary | ICD-10-CM | POA: Diagnosis not present

## 2016-07-14 DIAGNOSIS — Z227 Latent tuberculosis: Secondary | ICD-10-CM

## 2016-07-14 DIAGNOSIS — Z113 Encounter for screening for infections with a predominantly sexual mode of transmission: Secondary | ICD-10-CM

## 2016-07-14 DIAGNOSIS — Z119 Encounter for screening for infectious and parasitic diseases, unspecified: Secondary | ICD-10-CM

## 2016-07-14 DIAGNOSIS — B181 Chronic viral hepatitis B without delta-agent: Secondary | ICD-10-CM

## 2016-07-14 NOTE — Assessment & Plan Note (Signed)
She is tolerating rifampin well. She will complete 4 months of therapy.

## 2016-07-14 NOTE — Progress Notes (Signed)
[facility]  Patient Active Problem List   Diagnosis Date Noted  . Chronic hepatitis B without delta agent without cirrhosis (HCC) 05/10/2016    Priority: High  . Latent tuberculosis by blood test 05/10/2016    Priority: High  . Seasonal allergies 05/10/2016  . Microcytic anemia 05/10/2016  . Thrombocytopenia (HCC) 05/10/2016    Patient's Medications  New Prescriptions   No medications on file  Previous Medications   FERROUS SULFATE 325 (65 FE) MG EC TABLET    Take 325 mg by mouth 3 (three) times daily with meals.   OXYCODONE-ACETAMINOPHEN (ROXICET) 5-325 MG TABLET    Take 1-2 tablets by mouth every 4 (four) hours as needed.   RIFAMPIN (RIFADIN) 300 MG CAPSULE    Take 2 capsules (600 mg total) by mouth daily.   TENOFOVIR (VIREAD) 300 MG TABLET    Take 300 mg by mouth daily.  Modified Medications   No medications on file  Discontinued Medications   No medications on file    Subjective: Christine Mccarty is in for her routine follow-up visit. She is now completed 6 weeks of rifampin for latent tuberculosis. She also remains on Viread for her chronic hepatitis B. She is tolerating both well without any side effects.  Review of Systems: Review of Systems  Constitutional: Negative for chills, fever and weight loss.  Respiratory: Negative for cough, sputum production and shortness of breath.   Gastrointestinal: Negative for abdominal pain, heartburn, nausea and vomiting.    Past Medical History:  Diagnosis Date  . Anemia   . Arthritis   . Family history of adverse reaction to anesthesia    Pt Cousin was confused and a little agitated  . Gallstones   . Heart murmur    mild heart murmur in past  . Hepatitis    Hep B  . TB lung, latent     Social History  Substance Use Topics  . Smoking status: Never Smoker  . Smokeless tobacco: Never Used  . Alcohol use 0.6 oz/week    1 Standard drinks or equivalent per week     Comment: rare social  drinker    Family History  Problem Relation Age of Onset  . Hypertension Mother   . Arthritis Mother   . Hepatitis B Mother     No Known Allergies  Objective: Vitals:   07/14/16 0842  BP: 138/82  Pulse: 82  Temp: 97.6 F (36.4 C)  TempSrc: Oral  Weight: 197 lb (89.4 kg)   Body mass index is 29.95 kg/m.  Physical Exam  Constitutional: She is oriented to person, place, and time. No distress.  Abdominal: Soft. She exhibits no distension. There is no tenderness.  Neurological: She is oriented to person, place, and time.  Skin: No rash noted.  Psychiatric: Mood and affect normal.    Lab Results    Problem List Items Addressed This Visit      High   Chronic hepatitis B without delta agent without cirrhosis (HCC)    Her liver enzymes are normal and her hepatitis B DNA viral load is undetectable at less than 20. Her fibrosis score was 0. Her hepatitis B is under excellent control. She will continue Viread      Latent tuberculosis by blood test    She is tolerating rifampin well. She will complete 4 months of therapy.          Cliffton Asters, MD [facility] [phone] pager   [phone] cell 07/14/2016, 8:59 AM

## 2016-07-14 NOTE — Addendum Note (Signed)
Addended byJimmy Picket: ABBITT, KATRINA F on: 07/14/2016 09:24 AM   Modules accepted: Orders

## 2016-07-14 NOTE — Assessment & Plan Note (Signed)
Her liver enzymes are normal and her hepatitis B DNA viral load is undetectable at less than 20. Her fibrosis score was 0. Her hepatitis B is under excellent control. She will continue Viread

## 2016-07-15 LAB — RUBEOLA ANTIBODY IGG: Rubeola IgG: 280 AU/mL — ABNORMAL HIGH (ref <25.00)

## 2016-07-15 LAB — VARICELLA ZOSTER ANTIBODY, IGG: Varicella IgG: 2539 Index — ABNORMAL HIGH (ref <135.00)

## 2016-07-15 LAB — MUMPS ANTIBODY, IGG: Mumps IgG: >300 AU/mL — ABNORMAL HIGH (ref <9.00)

## 2016-09-05 ENCOUNTER — Ambulatory Visit
Admission: RE | Admit: 2016-09-05 | Discharge: 2016-09-05 | Disposition: A | Discharge status: Home / Self Care | Payer: Commercial Managed Care - PPO | Source: Ambulatory Visit | Attending: Nurse Practitioner | Admitting: Nurse Practitioner

## 2016-09-05 DIAGNOSIS — Z1231 Encounter for screening mammogram for malignant neoplasm of breast: Secondary | ICD-10-CM

## 2016-09-06 ENCOUNTER — Other Ambulatory Visit: Payer: Self-pay | Admitting: Nurse Practitioner

## 2016-09-06 DIAGNOSIS — R928 Other abnormal and inconclusive findings on diagnostic imaging of breast: Secondary | ICD-10-CM

## 2016-09-12 ENCOUNTER — Ambulatory Visit
Admission: RE | Admit: 2016-09-12 | Discharge: 2016-09-12 | Disposition: A | Discharge status: Home / Self Care | Payer: Commercial Managed Care - PPO | Source: Ambulatory Visit | Attending: Nurse Practitioner | Admitting: Nurse Practitioner

## 2016-09-12 DIAGNOSIS — R928 Other abnormal and inconclusive findings on diagnostic imaging of breast: Secondary | ICD-10-CM

## 2016-09-29 ENCOUNTER — Telehealth: Payer: Self-pay

## 2016-09-29 NOTE — Telephone Encounter (Signed)
Patient is requesting a note stating she is under treatment for latent TB for her employer.   I will write letter . Patient will pick up letter later today.    Laurell Josephs, RN

## 2016-10-11 ENCOUNTER — Ambulatory Visit (INDEPENDENT_AMBULATORY_CARE_PROVIDER_SITE_OTHER): Payer: Commercial Managed Care - PPO | Admitting: Internal Medicine

## 2016-10-11 ENCOUNTER — Telehealth: Payer: Self-pay | Admitting: Pharmacist

## 2016-10-11 ENCOUNTER — Encounter: Payer: Self-pay | Admitting: Internal Medicine

## 2016-10-11 DIAGNOSIS — B181 Chronic viral hepatitis B without delta-agent: Secondary | ICD-10-CM | POA: Diagnosis not present

## 2016-10-11 DIAGNOSIS — R7611 Nonspecific reaction to tuberculin skin test without active tuberculosis: Secondary | ICD-10-CM | POA: Diagnosis not present

## 2016-10-11 DIAGNOSIS — Z227 Latent tuberculosis: Secondary | ICD-10-CM

## 2016-10-11 MED ORDER — TENOFOVIR ALAFENAMIDE FUMARATE 25 MG PO TABS: 25.0000 mg | ORAL_TABLET | Freq: Every day | ORAL | 11 refills | Status: DC

## 2016-10-11 MED ORDER — TENOFOVIR ALAFENAMIDE FUMARATE 25 MG PO TABS
25.0000 mg | ORAL_TABLET | Freq: Every day | ORAL | 11 refills | Status: DC
Start: 2016-10-11 — End: 2017-02-15

## 2016-10-11 NOTE — Addendum Note (Signed)
Addended by: Robinette Haines on: 10/11/2016 02:15 PM   Modules accepted: Orders

## 2016-10-11 NOTE — Progress Notes (Signed)
[facility]  Patient Active Problem List   Diagnosis Date Noted  . Chronic hepatitis B without delta agent without cirrhosis (HCC) 05/10/2016    Priority: High  . Latent tuberculosis by blood test 05/10/2016    Priority: High  . Seasonal allergies 05/10/2016  . Microcytic anemia 05/10/2016  . Thrombocytopenia (HCC) 05/10/2016    Patient's Medications  New Prescriptions   TENOFOVIR ALAFENAMIDE FUMARATE (VEMLIDY) 25 MG TABS    Take 1 tablet (25 mg total) by mouth daily.  Previous Medications   FERROUS SULFATE 325 (65 FE) MG EC TABLET    Take 325 mg by mouth 3 (three) times daily with meals.   IBUPROFEN (ADVIL,MOTRIN) 400 MG TABLET    Take 400 mg by mouth.  Modified Medications   No medications on file  Discontinued Medications   OXYCODONE-ACETAMINOPHEN (ROXICET) 5-325 MG TABLET    Take 1-2 tablets by mouth every 4 (four) hours as needed.   RIFAMPIN (RIFADIN) 300 MG CAPSULE    Take 2 capsules (600 mg total) by mouth daily.   TENOFOVIR (VIREAD) 300 MG TABLET    Take 300 mg by mouth daily.    Subjective: Christine Mccarty is in for her routine follow-up visit. She has 1 more dose of INH and rifampin team to complete therapy for her latent tuberculosis. She's had no problems tolerating her medications. She is also taking daily tenofovir for her chronic hepatitis B. She is feeling well and without complaints.  Review of Systems: Review of Systems  Constitutional: Negative for chills, diaphoresis, fever, malaise/fatigue and weight loss.  HENT: Negative for sore throat.   Respiratory: Negative for cough, sputum production and shortness of breath.   Cardiovascular: Negative for chest pain.  Gastrointestinal: Negative for abdominal pain, diarrhea, heartburn, nausea and vomiting.  Genitourinary: Negative for dysuria and frequency.  Musculoskeletal: Negative for joint pain and myalgias.  Skin: Negative for rash.  Neurological: Negative for dizziness and  headaches.  Psychiatric/Behavioral: Negative for depression and substance abuse. The patient is not nervous/anxious.     Past Medical History:  Diagnosis Date  . Anemia   . Arthritis   . Family history of adverse reaction to anesthesia    Pt Cousin was confused and a little agitated  . Gallstones   . Heart murmur    mild heart murmur in past  . Hepatitis    Hep B  . TB lung, latent     Social History  Substance Use Topics  . Smoking status: Never Smoker  . Smokeless tobacco: Never Used  . Alcohol use 0.6 oz/week    1 Standard drinks or equivalent per week     Comment: rare social drinker    Family History  Problem Relation Age of Onset  . Hypertension Mother   . Arthritis Mother   . Hepatitis B Mother     No Known Allergies  Objective: Vitals:   10/11/16 0844  BP: 109/71  Pulse: 67  Temp: 98 F (36.7 C)  TempSrc: Oral  Weight: 194 lb (88 kg)  Height:  (1.727 m)   Body mass index is 29.5 kg/m.  Physical Exam  Constitutional: She is oriented to person, place, and time.  HENT:  Mouth/Throat: No oropharyngeal exudate.  Eyes: Conjunctivae are normal.  Cardiovascular: Normal rate and regular rhythm.   No murmur heard. Pulmonary/Chest: Effort normal and breath sounds normal.  Abdominal: Soft. She exhibits no mass. There is no tenderness.  Musculoskeletal:  Normal range of motion.  Neurological: She is alert and oriented to person, place, and time.  Skin: No rash noted.  Psychiatric: Mood and affect normal.    Lab Results    Problem List Items Addressed This Visit      High   Chronic hepatitis B without delta agent without cirrhosis (HCC)    Her hepatitis B viral load was undetectable at less than 20 in December. Her adherence is excellent. I will change her to the new, safer preparation called Vemlidy       Relevant Medications   Tenofovir Alafenamide Fumarate (VEMLIDY) 25 MG TABS   Other Relevant Orders   Comprehensive metabolic panel    Hepatitis B DNA, ultraquantitative, PCR   Hepatitis B e antibody   Hepatitis B e antigen   Latent tuberculosis by blood test    She will complete 12 weeks of therapy with INH and rifampin team next week.          Cliffton Asters, MD [facility] [phone] pager   [phone] cell 10/11/2016, 9:08 AM

## 2016-10-11 NOTE — Assessment & Plan Note (Signed)
She will complete 12 weeks of therapy with INH and rifampin team next week.

## 2016-10-11 NOTE — Assessment & Plan Note (Signed)
Her hepatitis B viral load was undetectable at less than 20 in December. Her adherence is excellent. I will change her to the new, safer preparation called [facility]

## 2016-10-11 NOTE — Telephone Encounter (Signed)
Patient's insurance did cover S[facility] but it required a specialty pharmacy - [facility].  Called Christine Mccarty and let her know that I sent the Rx there and gave her the # to the [facility] pharmacy.  Told her to call them if they haven't reached out to her by Friday.  Also told her to call me if she needed help with co pay assistance, etc.

## 2016-12-12 ENCOUNTER — Telehealth: Payer: Self-pay | Admitting: *Deleted

## 2016-12-12 NOTE — Telephone Encounter (Signed)
Patient requesting chest xray confirmation of no active disease.  RN sent information to the patient to activate her  MyChart account.  She will be able to print chest xray report from home.

## 2016-12-19 MED FILL — VEMLIDY/25MG/TABS: VEMLIDY/25MG/TABS | 30 days supply | Qty: 30 | Fill #2

## 2017-01-09 NOTE — Unmapped (Signed)
Specialty Pharmacy Refill Coordination Note     Kelly French is a 41 y.o. female contacted today regarding refills of her specialty medication(s).    Reviewed and verified with patient:      Specialty medication(s) and dose(s) confirmed: yes  Changes to medications: no  Changes to insurance: no    Medication Adherence    Medication Assistance Program  Refill Coordination  Has the Patient's Contact Information Changed:  No  Is the Shipping Address Different:  No  Shipping Information  Delivery Scheduled:  Yes  Delivery Date:  01/17/17  Medications to be Shipped:  VEMLIDY          Follow-up: 3 week(s)     Mitzi Davenport  Specialty Pharmacy Technician

## 2017-01-16 ENCOUNTER — Other Ambulatory Visit: Payer: Commercial Managed Care - PPO

## 2017-01-16 DIAGNOSIS — B181 Chronic viral hepatitis B without delta-agent: Secondary | ICD-10-CM

## 2017-01-16 LAB — COMPREHENSIVE METABOLIC PANEL
ALK PHOS: 80 U/L (ref 33–115)
ALT: 6 U/L (ref 6–29)
AST: 11 U/L (ref 10–30)
Albumin: 4 g/dL (ref 3.6–5.1)
BUN: 12 mg/dL (ref 7–25)
CALCIUM: 9 mg/dL (ref 8.6–10.2)
CO2: 24 mmol/L (ref 20–32)
Chloride: 106 mmol/L (ref 98–110)
Creat: 0.82 mg/dL (ref 0.50–1.10)
Glucose, Bld: 83 mg/dL (ref 65–99)
Potassium: 4.1 mmol/L (ref 3.5–5.3)
Sodium: 138 mmol/L (ref 135–146)
TOTAL PROTEIN: 6.5 g/dL (ref 6.1–8.1)
Total Bilirubin: 0.4 mg/dL (ref 0.2–1.2)

## 2017-01-16 MED FILL — VEMLIDY/25MG/TABS: VEMLIDY/25MG/TABS | 30 days supply | Qty: 30 | Fill #3

## 2017-01-18 LAB — HEPATITIS B E ANTIBODY: HEPATITIS BE ANTIBODY: REACTIVE — AB

## 2017-01-18 LAB — HEPATITIS B DNA, ULTRAQUANTITATIVE, PCR: Hepatitis B DNA (Calc): <1 Log IU/mL

## 2017-01-18 LAB — HEPATITIS B E ANTIGEN: HEPATITIS BE ANTIGEN: NONREACTIVE

## 2017-02-07 NOTE — Unmapped (Signed)
[facility] Specialty Pharmacy Refill Coordination Note  Specialty Medication(s): VEMLIDY  Additional Medications shipped:     Kelly French, DOB: [birth year]  Phone: [phone] (home) , Alternate phone contact: N/A  Phone or address changes today?: No  All above HIPAA information was verified with patient.  Shipping Address: [address]  [place] [address]   Insurance changes? No    Completed refill call assessment today to schedule patient's medication shipment from the [facility] [phone]).      Confirmed the medication and dosage are correct and have not changed: Yes, regimen is correct and unchanged.    Confirmed patient started or stopped the following medications in the past month:  No, there are no changes reported at this time.    Are you tolerating your medication?:  Kelly French reports tolerating the medication.    ADHERENCE    (Below is required for Medicare Part B or Transplant patients only - per drug):   How many tablets were dispensed last month:   Patient currently has  remaining.    Did you miss any doses in the past 4 weeks? Yes.  Kelly French reports missing 1 days of medication therapy in the last 4 weeks.  Kelly French reports MISSING NIGHT DOSE as the cause of their non-adherance.    FINANCIAL/SHIPPING    Delivery Scheduled: Yes, Expected medication delivery date: 9/5     Florella did not have any additional questions at this time.    Delivery address validated in [facility] scheduling system: Yes, address listed in [facility] is correct.    We will follow up with patient monthly for standard refill processing and delivery.      Thank you,  Darlin Coco   [facility] Specialty Technician

## 2017-02-14 ENCOUNTER — Telehealth: Payer: Self-pay

## 2017-02-14 NOTE — Telephone Encounter (Signed)
Patient left message voice

## 2017-02-15 ENCOUNTER — Other Ambulatory Visit: Payer: Self-pay | Admitting: *Deleted

## 2017-02-15 DIAGNOSIS — B181 Chronic viral hepatitis B without delta-agent: Secondary | ICD-10-CM

## 2017-02-15 MED ORDER — TENOFOVIR ALAFENAMIDE FUMARATE 25 MG PO TABS: 25.0000 mg | ORAL_TABLET | Freq: Every day | ORAL | 11 refills | Status: DC

## 2017-04-11 ENCOUNTER — Telehealth: Payer: Self-pay

## 2017-04-11 ENCOUNTER — Ambulatory Visit (INDEPENDENT_AMBULATORY_CARE_PROVIDER_SITE_OTHER): Payer: PRIVATE HEALTH INSURANCE | Admitting: Internal Medicine

## 2017-04-11 DIAGNOSIS — B181 Chronic viral hepatitis B without delta-agent: Secondary | ICD-10-CM | POA: Diagnosis not present

## 2017-04-11 NOTE — Telephone Encounter (Signed)
Per verbal order by Dr. Orvan Falconerampbell to call Radiology [facility] to schedule a Liver Ultrasound for the pt. Radiology scheduled the appt for November 11/2016 at 8:00 a.m. I will inform the pt about her appt.  Lorenso CourierJose L Maldonado, [place]

## 2017-04-11 NOTE — Progress Notes (Signed)
[facility]  Patient Active Problem List   Diagnosis Date Noted  . Chronic hepatitis B without delta agent without cirrhosis (HCC) 05/10/2016    Priority: High  . Latent tuberculosis by blood test 05/10/2016    Priority: High  . Seasonal allergies 05/10/2016  . Microcytic anemia 05/10/2016  . Thrombocytopenia (HCC) 05/10/2016    Patient's Medications  New Prescriptions   No medications on file  Previous Medications   FERROUS SULFATE 325 (65 FE) MG EC TABLET    Take 325 mg by mouth 3 (three) times daily with meals.   IBUPROFEN (ADVIL,MOTRIN) 400 MG TABLET    Take 400 mg by mouth.   TENOFOVIR ALAFENAMIDE FUMARATE (VEMLIDY) 25 MG TABS    Take 1 tablet (25 mg total) by mouth daily.  Modified Medications   No medications on file  Discontinued Medications   No medications on file    Subjective: Christine Mccarty is in for her routine hepatitis B follow-up. She has had no problems obtaining, taking or tolerating her family. She believes she is missed a couple of doses since her last visit when she fell asleep before taking it. The only other medication she is taking his iron supplements. She is feeling well. She reminds me that several years ago she stopped taking Viread and her viral load reactivated quickly.  Review of Systems: Review of Systems  Constitutional: Negative for chills, diaphoresis, fever and weight loss.  Gastrointestinal: Negative for abdominal pain, heartburn, nausea and vomiting.    Past Medical History:  Diagnosis Date  . Anemia   . Arthritis   . Family history of adverse reaction to anesthesia    Pt Cousin was confused and a little agitated  . Gallstones   . Heart murmur    mild heart murmur in past  . Hepatitis    Hep B  . TB lung, latent     Social History  Substance Use Topics  . Smoking status: Never Smoker  . Smokeless tobacco: Never Used  . Alcohol use 0.6 oz/week    1 Standard drinks or equivalent per week   Comment: rare social drinker    Family History  Problem Relation Age of Onset  . Hypertension Mother   . Arthritis Mother   . Hepatitis B Mother     No Known Allergies  Objective: Vitals:   04/11/17 0925  BP: 107/66  Pulse: 63  Temp: 98.5 F (36.9 C)  TempSrc: Oral  Weight: 197 lb (89.4 kg)   Body mass index is 29.95 kg/m.  Physical Exam  Constitutional: She is oriented to person, place, and time.  She is in good spirits.  HENT:  Mouth/Throat: No oropharyngeal exudate.  Cardiovascular: Normal rate and regular rhythm.   No murmur heard. Pulmonary/Chest: Effort normal and breath sounds normal.  Abdominal: Soft. She exhibits no distension. There is no tenderness.  Neurological: She is alert and oriented to person, place, and time.  Psychiatric: Mood and affect normal.    Lab Results CMP     Component Value Date/Time   NA 138 01/16/2017 1005   K 4.1 01/16/2017 1005   CL 106 01/16/2017 1005   CO2 24 01/16/2017 1005   GLUCOSE 83 01/16/2017 1005   BUN 12 01/16/2017 1005   CREATININE 0.82 01/16/2017 1005   CALCIUM 9.0 01/16/2017 1005   PROT 6.5 01/16/2017 1005   ALBUMIN 4.0 01/16/2017 1005   AST 11 01/16/2017 1005   ALT 6  01/16/2017 1005   ALT 7 05/10/2016 0950   ALKPHOS 80 01/16/2017 1005   BILITOT 0.4 01/16/2017 1005   GFRNONAA >60 06/02/2016 0947   GFRAA >60 06/02/2016 0947  Hepatitis B e antigen 01/16/2017: Negative Hepatitis B e antibody 01/16/2017: Positive Hepatitis B DNA viral load 01/16/2017: Less than 10    Problem List Items Addressed This Visit      High   Chronic hepatitis B without delta agent without cirrhosis (HCC)    Her chronic hepatitis B infection remains under excellent long-term control with Vemlidy. Her virus is undetectable and her liver enzymes remain normal. We had a discussion about whether or not she could have her stop treatment. I indicated that more than likely her virus would reactivate just like it did before but told her  that the whole field of hepatitis B management is evolving. There may be better more effective therapies in the near future. We will reevaluate her treatment at each visit. She will follow-up after lab work and an abdominal ultrasound in 6 months.      Relevant Orders   Hepatitis B DNA, ultraquantitative, PCR   Hepatitis B e antibody   Hepatitis B e antigen   US ABDOMEN COMPLETE W/ELASTOGRAPHY   Comprehensive metabolic panel       Cliffton Asters, MD [facility] for Infectious Disease [facility] [phone] pager   [phone] cell 04/11/2017, 9:56 AM

## 2017-04-11 NOTE — Assessment & Plan Note (Signed)
Her chronic hepatitis B infection remains under excellent long-term control with Vemlidy. Her virus is undetectable and her liver enzymes remain normal. We had a discussion about whether or not she could have her stop treatment. I indicated that more than likely her virus would reactivate just like it did before but told her that the whole field of hepatitis B management is evolving. There may be better more effective therapies in the near future. We will reevaluate her treatment at each visit. She will follow-up after lab work and an abdominal ultrasound in 6 months.

## 2017-04-11 NOTE — Telephone Encounter (Signed)
I called the pt to inform her of her appt at Radiology, however, I was not able to reach her directly so I left a voicemail. I informed Christine Mccarty about her appt at Radiology for her Liver ultrasound and that it was on 04/18/17 at 8 am, and that she should arrive 15 mins before her appt. I also informed her that she could not have anything to eat or drink six hours prior to he appt.  Lorenso CourierJose L Maldonado, [place]

## 2017-04-11 NOTE — Progress Notes (Deleted)
No issues getting vemlidy [facility] No side effects Misses doses every once in a while - may 2x a month

## 2017-04-13 ENCOUNTER — Ambulatory Visit: Payer: Commercial Managed Care - PPO | Admitting: Internal Medicine

## 2017-04-18 ENCOUNTER — Ambulatory Visit (HOSPITAL_COMMUNITY): Payer: PRIVATE HEALTH INSURANCE

## 2017-05-02 NOTE — Unmapped (Signed)
Have not sent medication or been able to make contact with the patient for several months.  Left a voicemail for them on 04/14/17 for them to return our call if they needed anything.  At this point, we will disenroll them from specialty pharmacy services until we hear back from them.

## 2017-05-30 ENCOUNTER — Other Ambulatory Visit: Payer: Self-pay | Admitting: Physician Assistant

## 2017-05-30 ENCOUNTER — Ambulatory Visit
Admission: RE | Admit: 2017-05-30 | Discharge: 2017-05-30 | Disposition: A | Discharge status: Home / Self Care | Payer: PRIVATE HEALTH INSURANCE | Source: Ambulatory Visit | Attending: Physician Assistant | Admitting: Physician Assistant

## 2017-05-30 DIAGNOSIS — M25561 Pain in right knee: Principal | ICD-10-CM

## 2017-05-30 DIAGNOSIS — G8929 Other chronic pain: Secondary | ICD-10-CM

## 2017-09-25 ENCOUNTER — Other Ambulatory Visit: Payer: PRIVATE HEALTH INSURANCE

## 2017-09-25 DIAGNOSIS — B181 Chronic viral hepatitis B without delta-agent: Secondary | ICD-10-CM

## 2017-09-26 LAB — COMPREHENSIVE METABOLIC PANEL
AG Ratio: 1.6 (calc) (ref 1.0–2.5)
ALBUMIN MSPROF: 4.4 g/dL (ref 3.6–5.1)
ALT: 6 U/L (ref 6–29)
AST: 12 U/L (ref 10–30)
Alkaline phosphatase (APISO): 83 U/L (ref 33–115)
BUN: 13 mg/dL (ref 7–25)
CO2: 25 mmol/L (ref 20–32)
CREATININE: 1.08 mg/dL (ref 0.50–1.10)
Calcium: 9.8 mg/dL (ref 8.6–10.2)
Chloride: 104 mmol/L (ref 98–110)
GLUCOSE: 76 mg/dL (ref 65–99)
Globulin: 2.8 g/dL (calc) (ref 1.9–3.7)
POTASSIUM: 4.3 mmol/L (ref 3.5–5.3)
SODIUM: 138 mmol/L (ref 135–146)
TOTAL PROTEIN: 7.2 g/dL (ref 6.1–8.1)
Total Bilirubin: 0.4 mg/dL (ref 0.2–1.2)

## 2017-09-26 LAB — HEPATITIS B E ANTIGEN: Hep B E Ag: NONREACTIVE

## 2017-09-26 LAB — HEPATITIS B E ANTIBODY: HEP B E AB: REACTIVE — AB

## 2017-09-27 LAB — HEPATITIS B DNA, ULTRAQUANTITATIVE, PCR
Hepatitis B DNA (Calc): <1 Log IU/mL
Hepatitis B DNA: <10 IU/mL

## 2017-10-10 ENCOUNTER — Ambulatory Visit: Payer: PRIVATE HEALTH INSURANCE | Admitting: Internal Medicine

## 2017-10-26 IMAGING — MG DIGITAL SCREENING BILATERAL MAMMOGRAM WITH CAD
4 series · 4 of 4 positions shown · non-contrast
Comparison: None.

CLINICAL DATA: Screening.

EXAM:
DIGITAL SCREENING BILATERAL MAMMOGRAM WITH CAD

[R MLO]
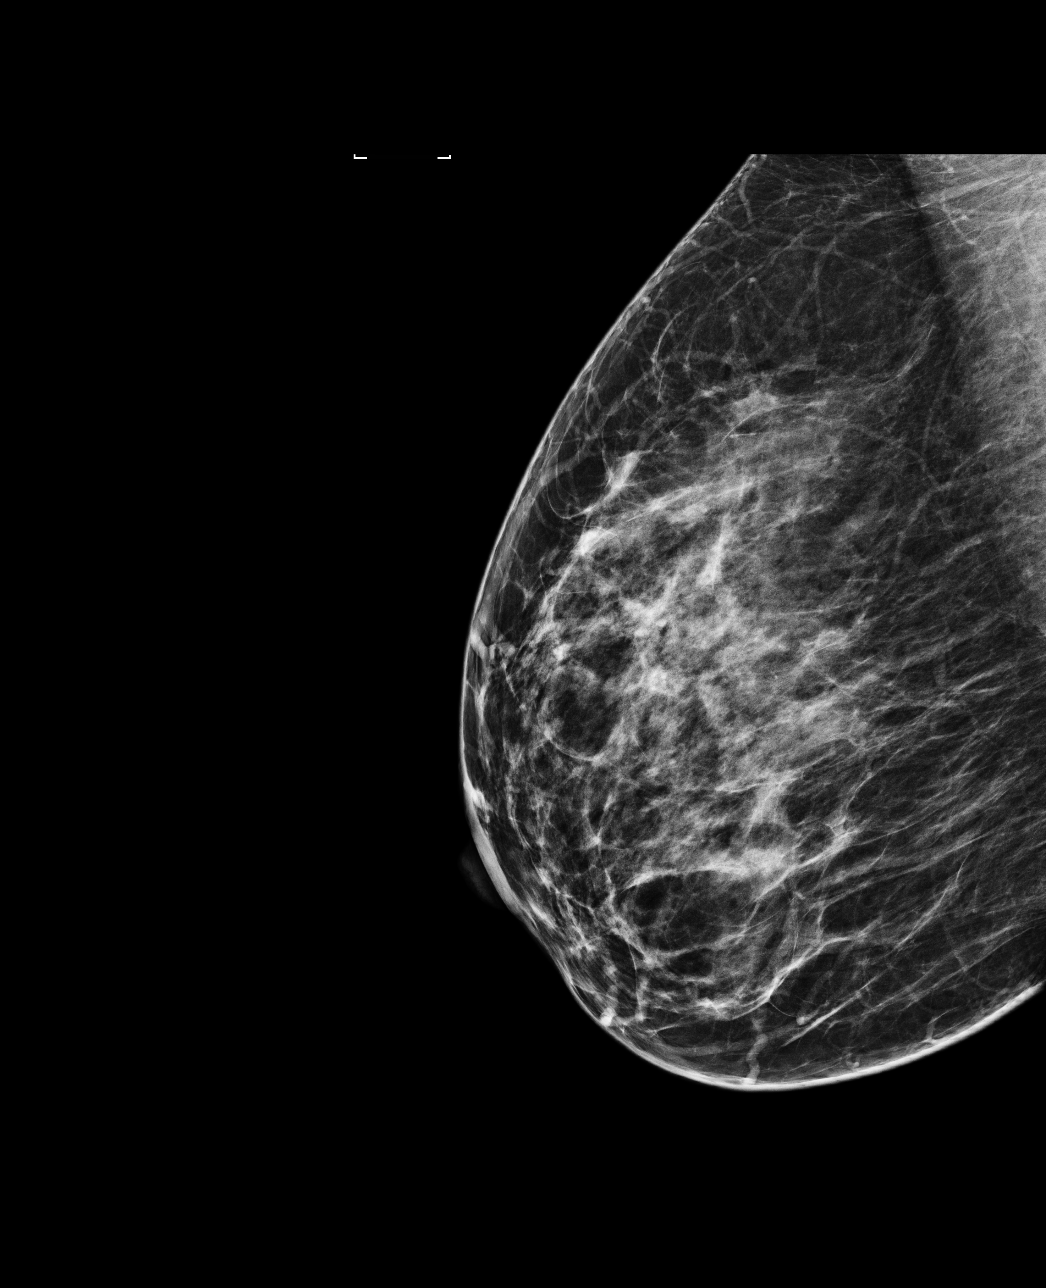

[L CC]
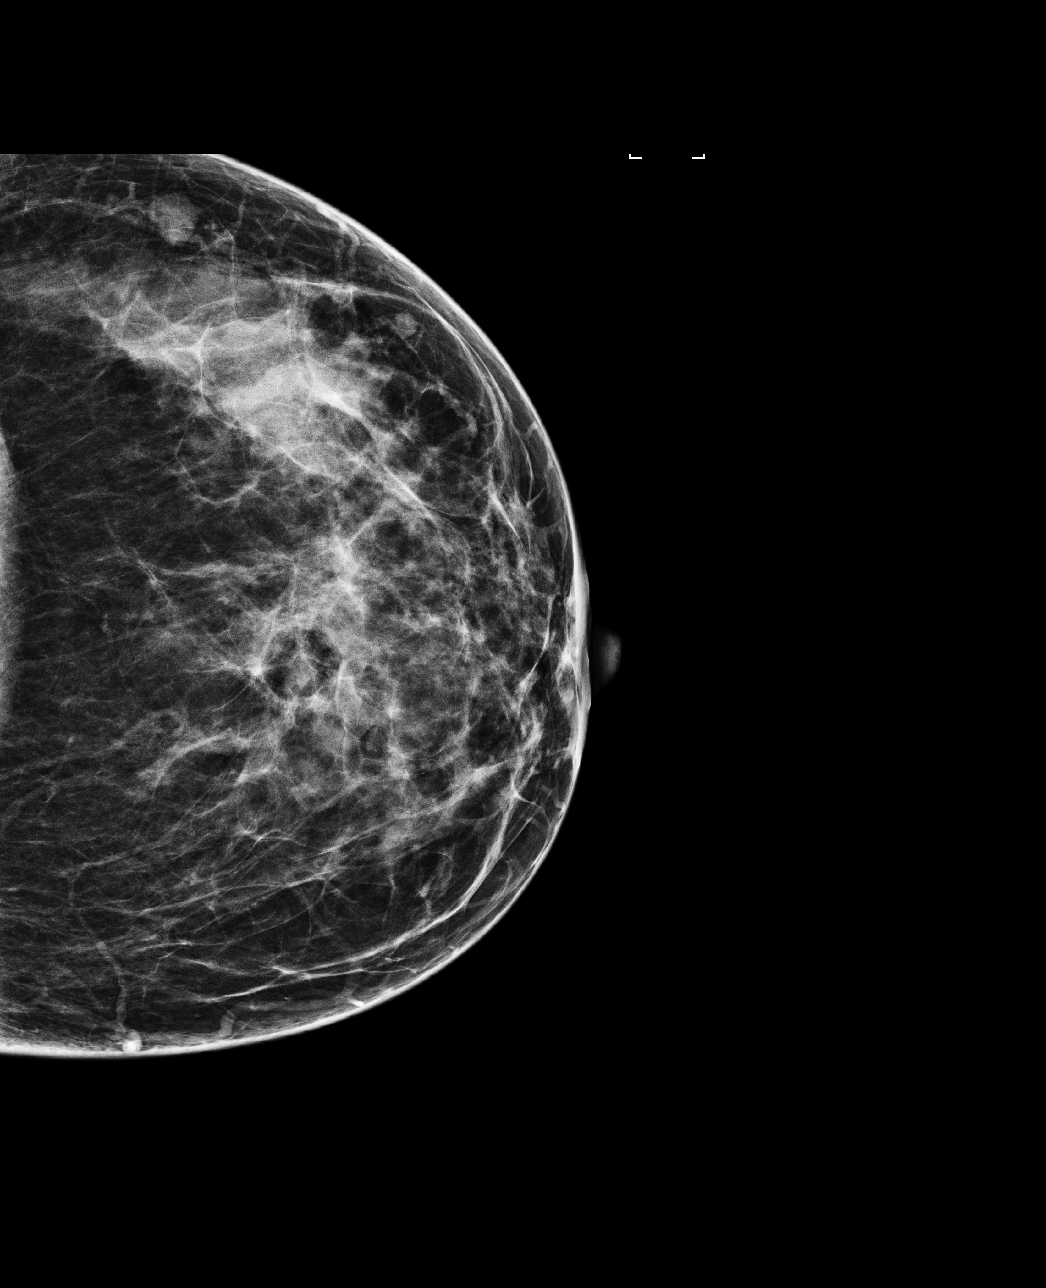

[R CC]
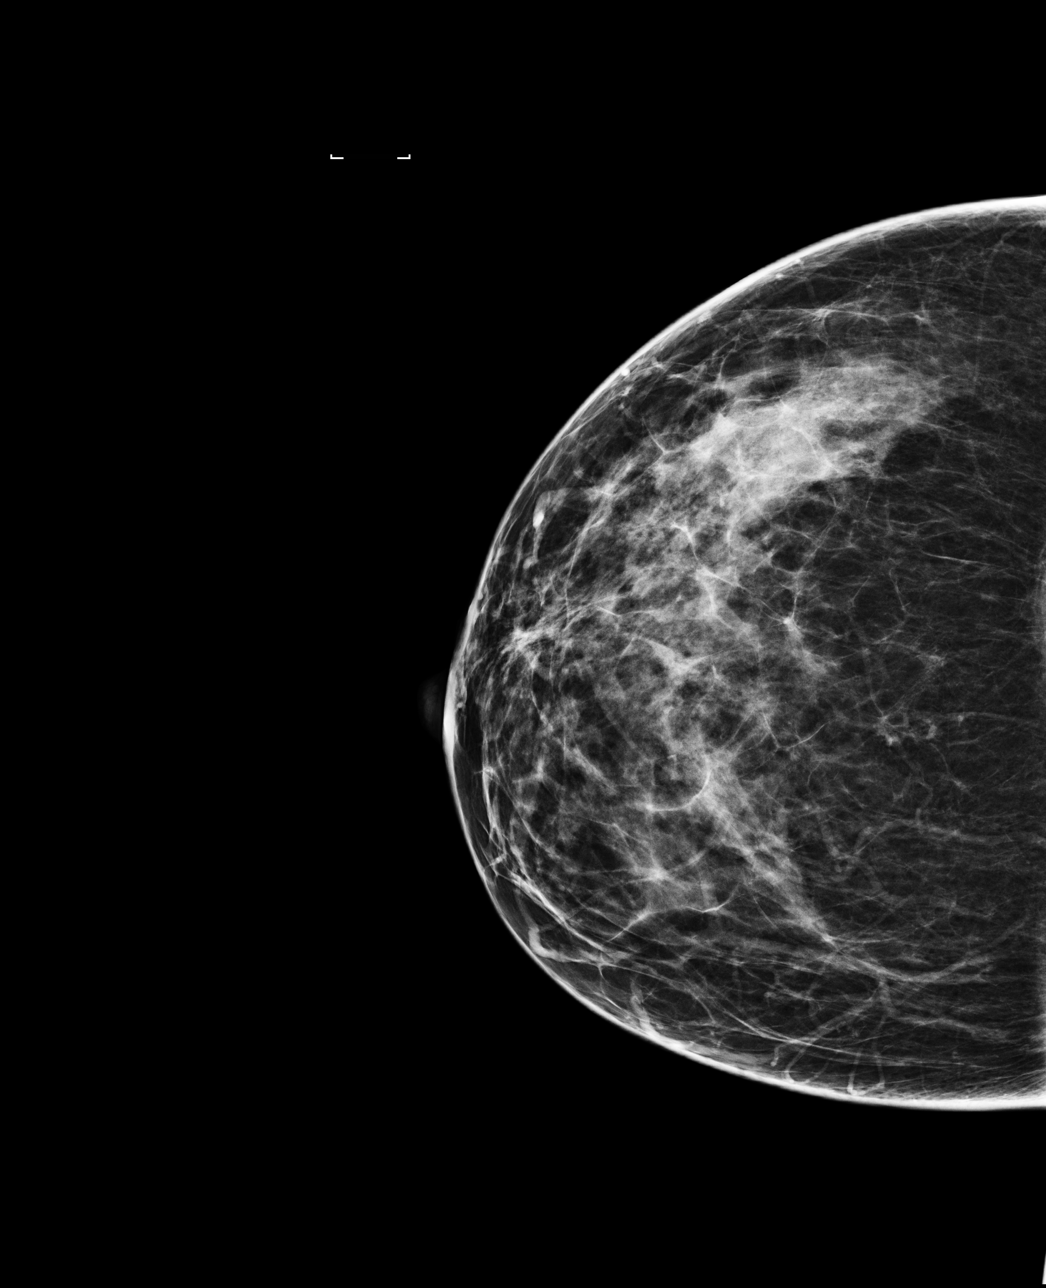

[L MLO]
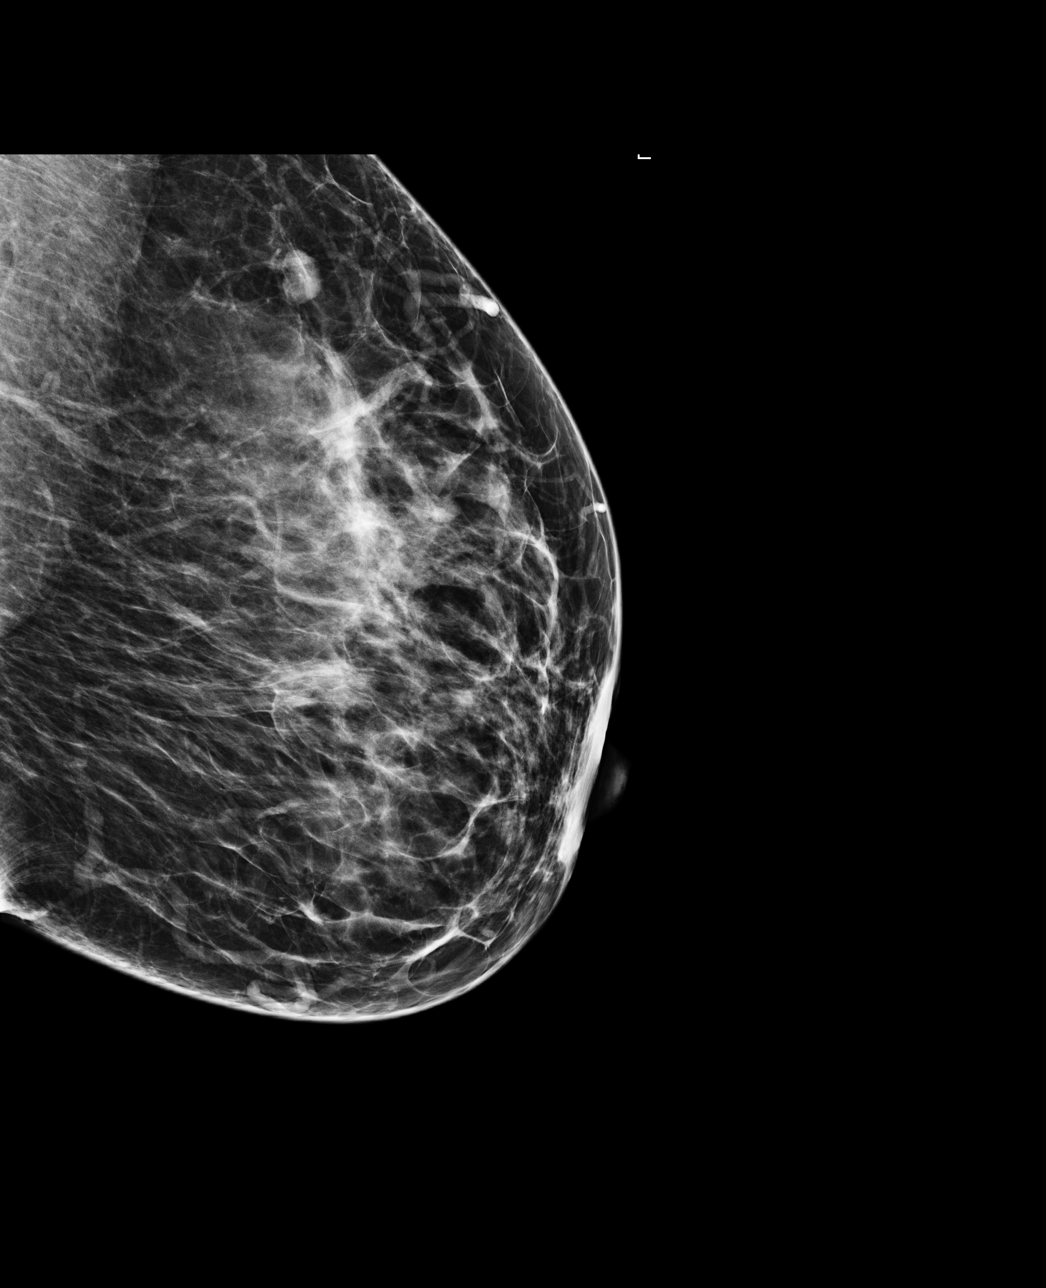

[4 of 4 positions shown; findings below may reference images not displayed]

ACR Breast Density Category c: The breast tissue is heterogeneously
dense, which may obscure small masses.
FINDINGS: In the left breast, possible masses warrant further evaluation. In
the right breast, no findings suspicious for malignancy.

Images were processed with CAD.
IMPRESSION: Further evaluation is suggested for possible masses in the left
breast.

RECOMMENDATION:
Diagnostic mammogram and possibly ultrasound of the left breast.
(Code:[id number])

The patient will be contacted regarding the findings, and additional
imaging will be scheduled.

BI-RADS CATEGORY  0: Incomplete. Need additional imaging evaluation
and/or prior mammograms for comparison.

## 2017-11-14 ENCOUNTER — Ambulatory Visit: Payer: PRIVATE HEALTH INSURANCE | Admitting: Internal Medicine

## 2017-12-29 ENCOUNTER — Telehealth: Payer: Self-pay | Admitting: *Deleted

## 2017-12-29 NOTE — Telephone Encounter (Signed)
[id number]. VEMLIDY TAB 25MG  is approved through 12/30/2019.  Notified pharmacy via fax. Andree CossHowell, Grayling Schranz M, RN

## 2018-01-01 ENCOUNTER — Other Ambulatory Visit: Payer: Self-pay | Admitting: *Deleted

## 2018-01-01 DIAGNOSIS — B181 Chronic viral hepatitis B without delta-agent: Secondary | ICD-10-CM

## 2018-01-01 MED ORDER — TENOFOVIR ALAFENAMIDE FUMARATE 25 MG PO TABS: 25.0000 mg | ORAL_TABLET | Freq: Every day | ORAL | 11 refills | Status: DC

## 2018-01-03 ENCOUNTER — Telehealth: Payer: Self-pay | Admitting: *Deleted

## 2018-01-03 DIAGNOSIS — B181 Chronic viral hepatitis B without delta-agent: Secondary | ICD-10-CM

## 2018-01-03 MED ORDER — TENOFOVIR ALAFENAMIDE FUMARATE 25 MG PO TABS: 25.0000 mg | ORAL_TABLET | Freq: Every day | ORAL | 11 refills | Status: DC

## 2018-01-03 NOTE — Telephone Encounter (Signed)
[facility] called with another pharmacy update. [facility] must now use [facility] not [facility], not [facility].  [facility]. Andree CossHowell, Marlon Suleiman M, RN

## 2018-05-17 ENCOUNTER — Ambulatory Visit (INDEPENDENT_AMBULATORY_CARE_PROVIDER_SITE_OTHER): Payer: 59 | Admitting: Internal Medicine

## 2018-05-17 ENCOUNTER — Encounter: Payer: Self-pay | Admitting: Internal Medicine

## 2018-05-17 DIAGNOSIS — B181 Chronic viral hepatitis B without delta-agent: Secondary | ICD-10-CM

## 2018-05-17 NOTE — Assessment & Plan Note (Signed)
Her chronic hepatitis B has been under excellent long-term control.  She will continue Vemlidy and get repeat lab work today.  She will follow-up in 6 months.  I have given her a pill canister today and encouraged her to try to not miss any doses.

## 2018-05-17 NOTE — Progress Notes (Signed)
[facility]  Patient Active Problem List   Diagnosis Date Noted  . Chronic hepatitis B without delta agent without cirrhosis (HCC) 05/10/2016    Priority: High  . Latent tuberculosis by blood test 05/10/2016    Priority: High  . Seasonal allergies 05/10/2016  . Microcytic anemia 05/10/2016  . Thrombocytopenia (HCC) 05/10/2016    Patient's Medications  New Prescriptions   No medications on file  Previous Medications   FERROUS SULFATE 325 (65 FE) MG EC TABLET    Take 325 mg by mouth 3 (three) times daily with meals.   IBUPROFEN (ADVIL,MOTRIN) 400 MG TABLET    Take 400 mg by mouth.   TENOFOVIR ALAFENAMIDE FUMARATE (VEMLIDY) 25 MG TABS    Take 1 tablet (25 mg total) by mouth daily.  Modified Medications   No medications on file  Discontinued Medications   No medications on file    Subjective: Noelie is in for her routine hepatitis B follow-up. She has had no problems obtaining, taking or tolerating her Vemlidy.  She is feeling well.  She recalls missing a dose 1 week ago when she slept late and forgot to take it.  She realize she had missed late that evening and simply waited until the next morning to take her routine dose.  She says that this happens only "once in a blue moon".  She reminds me that several years ago she stopped taking Viread and her viral load reactivated quickly.  Review of Systems: Review of Systems  Constitutional: Negative for chills, diaphoresis, fever and weight loss.  Gastrointestinal: Negative for abdominal pain, heartburn, nausea and vomiting.    Past Medical History:  Diagnosis Date  . Anemia   . Arthritis   . Family history of adverse reaction to anesthesia    Pt Cousin was confused and a little agitated  . Gallstones   . Heart murmur    mild heart murmur in past  . Hepatitis    Hep B  . TB lung, latent     Social History   Tobacco Use  . Smoking status: Never Smoker  . Smokeless tobacco: Never Used    Substance Use Topics  . Alcohol use: Yes    Alcohol/week: 1.0 standard drinks    Types: 1 Standard drinks or equivalent per week    Comment: rare social drinker  . Drug use: No    Family History  Problem Relation Age of Onset  . Hypertension Mother   . Arthritis Mother   . Hepatitis B Mother     No Known Allergies  Objective: Vitals:   05/17/18 0839  BP: 125/79  Pulse: 73  Temp: 98.1 F (36.7 C)  Weight: 205 lb (93 kg)   Body mass index is 31.17 kg/m.  Physical Exam  Constitutional: She is oriented to person, place, and time.  She is in good spirits.  HENT:  Mouth/Throat: No oropharyngeal exudate.  Cardiovascular: Normal rate and regular rhythm.  No murmur heard. Pulmonary/Chest: Effort normal and breath sounds normal.  Abdominal: Soft. She exhibits no distension. There is no tenderness.  Neurological: She is alert and oriented to person, place, and time.  Psychiatric: Mood and affect normal.    Lab Results CMP     Component Value Date/Time   NA 138 09/25/2017 0938   K 4.3 09/25/2017 0938   CL 104 09/25/2017 0938   CO2 25 09/25/2017 0938   GLUCOSE 76 09/25/2017 1610  BUN 13 09/25/2017 0938   CREATININE 1.08 09/25/2017 0938   CALCIUM 9.8 09/25/2017 0938   PROT 7.2 09/25/2017 0938   ALBUMIN 4.0 01/16/2017 1005   AST 12 09/25/2017 0938   ALT 6 09/25/2017 0938   ALT 7 05/10/2016 0950   ALKPHOS 80 01/16/2017 1005   BILITOT 0.4 09/25/2017 0938   GFRNONAA >60 06/02/2016 0947   GFRAA >60 06/02/2016 0947  Hepatitis B e antigen 09/25/2017: Negative Hepatitis B e antibody 09/25/2017: Positive Hepatitis B DNA viral load 09/25/2017: Less than 10    Problem List Items Addressed This Visit      High   Chronic hepatitis B without delta agent without cirrhosis (HCC)    Her chronic hepatitis B has been under excellent long-term control.  She will continue Vemlidy and get repeat lab work today.  She will follow-up in 6 months.  I have given her a pill canister  today and encouraged her to try to not miss any doses.      Relevant Orders   Hepatitis B e antibody   Hepatitis B e antigen   Hepatitis B DNA, ultraquantitative, PCR   CBC   Comprehensive metabolic panel       Cliffton AstersJohn Hosea Hanawalt, MD [facility] [phone] pager   [phone] cell 05/17/2018, 8:59 AM

## 2018-05-19 LAB — COMPREHENSIVE METABOLIC PANEL
AG Ratio: 1.8 (calc) (ref 1.0–2.5)
ALBUMIN MSPROF: 4.6 g/dL (ref 3.6–5.1)
ALKALINE PHOSPHATASE (APISO): 87 U/L (ref 33–115)
ALT: 8 U/L (ref 6–29)
AST: 15 U/L (ref 10–30)
BILIRUBIN TOTAL: 0.3 mg/dL (ref 0.2–1.2)
BUN: 8 mg/dL (ref 7–25)
CALCIUM: 9.3 mg/dL (ref 8.6–10.2)
CO2: 29 mmol/L (ref 20–32)
CREATININE: 0.91 mg/dL (ref 0.50–1.10)
Chloride: 104 mmol/L (ref 98–110)
Globulin: 2.6 g/dL (calc) (ref 1.9–3.7)
Glucose, Bld: 89 mg/dL (ref 65–99)
Potassium: 4.2 mmol/L (ref 3.5–5.3)
Sodium: 138 mmol/L (ref 135–146)
Total Protein: 7.2 g/dL (ref 6.1–8.1)

## 2018-05-19 LAB — CBC
HCT: 34.6 % — ABNORMAL LOW (ref 35.0–45.0)
Hemoglobin: 11.5 g/dL — ABNORMAL LOW (ref 11.7–15.5)
MCH: 28.4 pg (ref 27.0–33.0)
MCHC: 33.2 g/dL (ref 32.0–36.0)
MCV: 85.4 fL (ref 80.0–100.0)
MPV: 13.2 fL — AB (ref 7.5–12.5)
PLATELETS: 107 10*3/uL — AB (ref 140–400)
RBC: 4.05 10*6/uL (ref 3.80–5.10)
RDW: 13.3 % (ref 11.0–15.0)
WBC: 4.4 10*3/uL (ref 3.8–10.8)

## 2018-05-19 LAB — HEPATITIS B DNA, ULTRAQUANTITATIVE, PCR
Hepatitis B DNA (Calc): <1 Log IU/mL — ABNORMAL HIGH
Hepatitis B DNA: <10 IU/mL — ABNORMAL HIGH

## 2018-05-19 LAB — HEPATITIS B E ANTIBODY: HEP B E AB: REACTIVE — AB

## 2018-05-19 LAB — HEPATITIS B E ANTIGEN: Hep B E Ag: NONREACTIVE

## 2018-09-26 ENCOUNTER — Telehealth: Payer: Self-pay

## 2018-09-26 NOTE — Telephone Encounter (Signed)
Received fax from [facility] stating they have made multiple attempts to reach patient regarding refill for [facility]. Left voicemail for patient to call pharmacy to schedule refill Christine Mccarty, CMA

## 2018-11-15 ENCOUNTER — Other Ambulatory Visit: Payer: Self-pay | Admitting: *Deleted

## 2018-11-15 DIAGNOSIS — B181 Chronic viral hepatitis B without delta-agent: Secondary | ICD-10-CM

## 2018-11-16 ENCOUNTER — Other Ambulatory Visit: Payer: 59

## 2018-11-19 ENCOUNTER — Other Ambulatory Visit: Payer: 59

## 2018-11-20 ENCOUNTER — Other Ambulatory Visit: Payer: No Typology Code available for payment source

## 2018-11-20 ENCOUNTER — Other Ambulatory Visit: Payer: Self-pay

## 2018-11-20 DIAGNOSIS — B181 Chronic viral hepatitis B without delta-agent: Secondary | ICD-10-CM

## 2018-11-22 LAB — CBC WITH DIFFERENTIAL/PLATELET
Absolute Monocytes: 312 cells/uL (ref 200–950)
Basophils Absolute: 42 cells/uL (ref 0–200)
Basophils Relative: 1.1 %
Eosinophils Absolute: 68 cells/uL (ref 15–500)
Eosinophils Relative: 1.8 %
HCT: 37 % (ref 35.0–45.0)
Hemoglobin: 12 g/dL (ref 11.7–15.5)
Lymphs Abs: 1072 cells/uL (ref 850–3900)
MCH: 27.6 pg (ref 27.0–33.0)
MCHC: 32.4 g/dL (ref 32.0–36.0)
MCV: 85.3 fL (ref 80.0–100.0)
MPV: 13 fL — ABNORMAL HIGH (ref 7.5–12.5)
Monocytes Relative: 8.2 %
Neutro Abs: 2307 cells/uL (ref 1500–7800)
Neutrophils Relative %: 60.7 %
Platelets: 113 10*3/uL — ABNORMAL LOW (ref 140–400)
RBC: 4.34 10*6/uL (ref 3.80–5.10)
RDW: 13.6 % (ref 11.0–15.0)
Total Lymphocyte: 28.2 %
WBC: 3.8 10*3/uL (ref 3.8–10.8)

## 2018-11-22 LAB — COMPLETE METABOLIC PANEL WITH GFR
AG Ratio: 1.7 (calc) (ref 1.0–2.5)
ALT: 7 U/L (ref 6–29)
AST: 16 U/L (ref 10–30)
Albumin: 4.2 g/dL (ref 3.6–5.1)
Alkaline phosphatase (APISO): 81 U/L (ref 31–125)
BUN: 11 mg/dL (ref 7–25)
CO2: 27 mmol/L (ref 20–32)
Calcium: 9.3 mg/dL (ref 8.6–10.2)
Chloride: 104 mmol/L (ref 98–110)
Creat: 0.96 mg/dL (ref 0.50–1.10)
GFR, Est African American: 83 mL/min/{1.73_m2} (ref >=60)
GFR, Est Non African American: 72 mL/min/{1.73_m2} (ref >=60)
Globulin: 2.5 g/dL (calc) (ref 1.9–3.7)
Glucose, Bld: 86 mg/dL (ref 65–99)
Potassium: 4.1 mmol/L (ref 3.5–5.3)
Sodium: 138 mmol/L (ref 135–146)
Total Bilirubin: 0.4 mg/dL (ref 0.2–1.2)
Total Protein: 6.7 g/dL (ref 6.1–8.1)

## 2018-11-22 LAB — HEPATITIS B DNA, ULTRAQUANTITATIVE, PCR
Hepatitis B DNA (Calc): <1 Log IU/mL
Hepatitis B DNA: <10 IU/mL

## 2018-12-03 ENCOUNTER — Ambulatory Visit (INDEPENDENT_AMBULATORY_CARE_PROVIDER_SITE_OTHER): Payer: No Typology Code available for payment source | Admitting: Internal Medicine

## 2018-12-03 ENCOUNTER — Other Ambulatory Visit: Payer: Self-pay

## 2018-12-03 ENCOUNTER — Encounter: Payer: Self-pay | Admitting: Internal Medicine

## 2018-12-03 DIAGNOSIS — B181 Chronic viral hepatitis B without delta-agent: Secondary | ICD-10-CM

## 2018-12-03 MED ORDER — VEMLIDY 25 MG PO TABS
25.0000 mg | ORAL_TABLET | Freq: Every day | ORAL | 11 refills | Status: DC
Start: 2018-12-03 — End: 2019-08-06

## 2018-12-03 NOTE — Assessment & Plan Note (Signed)
Her hepatitis B infection remains under excellent, long-term control.  She will continue Vemlidy and follow-up after lab work in 6 months.

## 2018-12-03 NOTE — Addendum Note (Signed)
Addended by: Michel Bickers on: 12/03/2018 09:13 AM   Modules accepted: Orders

## 2018-12-03 NOTE — Progress Notes (Signed)
[name]  Patient Active Problem List   Diagnosis Date Noted  . Chronic hepatitis B without delta agent without cirrhosis (HCC) 05/10/2016    Priority: High  . Latent tuberculosis by blood test 05/10/2016  . Seasonal allergies 05/10/2016  . Microcytic anemia 05/10/2016  . Thrombocytopenia ([place]) 05/10/2016    Patient's Medications  New Prescriptions   No medications on file  Previous Medications   CALCIUM-VITAMIN D (OSCAL WITH D) 250-125 MG-UNIT TABLET    Take 1 tablet by mouth daily.   FERROUS SULFATE 325 (65 FE) MG EC TABLET    Take 325 mg by mouth 3 (three) times daily with meals.   IBUPROFEN (ADVIL,MOTRIN) 400 MG TABLET    Take 400 mg by mouth.   LINACLOTIDE (LINZESS) 145 MCG CAPS CAPSULE    Take by mouth.   TENOFOVIR ALAFENAMIDE FUMARATE (VEMLIDY) 25 MG TABS    Take 1 tablet (25 mg total) by mouth daily.  Modified Medications   No medications on file  Discontinued Medications   No medications on file    Subjective: Christine Mccarty is in for her routine hepatitis B follow-up. She has had no problems obtaining, taking or tolerating her Vemlidy.  She does not recall missing any doses she is feeling well.    Review of Systems: Review of Systems  Constitutional: Negative for chills, diaphoresis, fever and weight loss.  Gastrointestinal: Negative for abdominal pain, heartburn, nausea and vomiting.    Past Medical History:  Diagnosis Date  . Anemia   . Arthritis   . Family history of adverse reaction to anesthesia    Pt Cousin was confused and a little agitated  . Gallstones   . Heart murmur    mild heart murmur in past  . Hepatitis    Hep B  . TB lung, latent     Social History   Tobacco Use  . Smoking status: Never Smoker  . Smokeless tobacco: Never Used  Substance Use Topics  . Alcohol use: Yes    Alcohol/week: 1.0 standard drinks    Types: 1 Standard drinks or equivalent per week    Comment: rare social drinker  . Drug  use: No    Family History  Problem Relation Age of Onset  . Hypertension Mother   . Arthritis Mother   . Hepatitis B Mother     Allergies  Allergen Reactions  . Prednisone     Other reaction(s): Other (See Comments) Causes a flare in Hep. B    Objective: Vitals:   12/03/18 0847  BP: 108/69  Pulse: 62  Temp: 97.8 F (36.6 C)  TempSrc: Oral  SpO2: 100%  Weight: 197 lb (89.4 kg)  Height: 5\' 8"  (1.727 m)   Body mass index is 29.95 kg/m.  Physical Exam  Constitutional: She is oriented to person, place, and time.  She is in good spirits as usual.  HENT:  Mouth/Throat: No oropharyngeal exudate.  Cardiovascular: Normal rate and regular rhythm.  No murmur heard. Pulmonary/Chest: Effort normal and breath sounds normal.  Abdominal: Soft. She exhibits no distension. There is no abdominal tenderness.  Neurological: She is alert and oriented to person, place, and time.  Psychiatric: Mood and affect normal.    Lab Results CMP     Component Value Date/Time   NA 138 11/20/2018 0849   K 4.1 11/20/2018 0849   CL 104 11/20/2018 0849   CO2 27 11/20/2018 0849   GLUCOSE 86 11/20/2018  0849   BUN 11 11/20/2018 0849   CREATININE 0.96 11/20/2018 0849   CALCIUM 9.3 11/20/2018 0849   PROT 6.7 11/20/2018 0849   ALBUMIN 4.0 01/16/2017 1005   AST 16 11/20/2018 0849   ALT 7 11/20/2018 0849   ALT 7 05/10/2016 0950   ALKPHOS 80 01/16/2017 1005   BILITOT 0.4 11/20/2018 0849   GFRNONAA 72 11/20/2018 0849   GFRAA 83 11/20/2018 0849  Hepatitis B e antigen 11/20/2018: Negative Hepatitis B e antibody 11/20/2018: Positive Hepatitis B DNA viral load 11/20/2018: Less than 10    Problem List Items Addressed This Visit      High   Chronic hepatitis B without delta agent without cirrhosis (HCC)    Her hepatitis B infection remains under excellent, long-term control.  She will continue Vemlidy and follow-up after lab work in 6 months.      Relevant Orders   Hepatitis B DNA,  ultraquantitative, PCR   Hepatitis B e antibody   Hepatitis B e antigen   Comprehensive metabolic panel       Cliffton AstersJohn Marvyn Torrez, MD [facility] [phone] pager   [phone] cell 12/03/2018, 9:01 AM

## 2019-02-21 ENCOUNTER — Telehealth: Payer: Self-pay

## 2019-02-21 NOTE — Telephone Encounter (Signed)
Called  Kiyah, left message to give office a call, voicemail not setup/unable to leave message.     Lenore Cordia, [place]

## 2019-02-21 NOTE — Telephone Encounter (Signed)
Received fax from [place] regarding unable to communicate with [facility] to have her Vemlidy TAB 25MG  refilled.     Lenore Cordia, [place]

## 2019-02-21 NOTE — Telephone Encounter (Signed)
Can you send her a text or a MyChart message?

## 2019-02-25 NOTE — Telephone Encounter (Addendum)
Follow up call to patient. Patient stated she had some left over and that is why she did not contact pharmacy. She has since contacted pharmacy and is expecting medication delivery tomorrow. Patient states she has not missed any doses of her Vemlidy. Patient was very appreciative of call Christine Mccarty

## 2019-02-25 NOTE — Telephone Encounter (Signed)
Thanks for following up with Christine Mccarty.

## 2019-05-20 ENCOUNTER — Other Ambulatory Visit: Payer: No Typology Code available for payment source

## 2019-06-04 ENCOUNTER — Ambulatory Visit: Payer: No Typology Code available for payment source | Admitting: Internal Medicine

## 2019-07-10 ENCOUNTER — Other Ambulatory Visit: Payer: No Typology Code available for payment source

## 2019-07-24 ENCOUNTER — Other Ambulatory Visit: Payer: No Typology Code available for payment source

## 2019-07-24 ENCOUNTER — Other Ambulatory Visit: Payer: Self-pay

## 2019-07-24 DIAGNOSIS — B181 Chronic viral hepatitis B without delta-agent: Secondary | ICD-10-CM

## 2019-07-26 LAB — HEPATITIS B E ANTIGEN: Hep B E Ag: NONREACTIVE

## 2019-07-26 LAB — COMPREHENSIVE METABOLIC PANEL
AG Ratio: 1.5 (calc) (ref 1.0–2.5)
ALT: 7 U/L (ref 6–29)
AST: 13 U/L (ref 10–35)
Albumin: 4.3 g/dL (ref 3.6–5.1)
Alkaline phosphatase (APISO): 84 U/L (ref 31–125)
BUN: 13 mg/dL (ref 7–25)
CO2: 29 mmol/L (ref 20–32)
Calcium: 9.7 mg/dL (ref 8.6–10.2)
Chloride: 104 mmol/L (ref 98–110)
Creat: 1.06 mg/dL (ref 0.50–1.10)
Globulin: 2.8 g/dL (calc) (ref 1.9–3.7)
Glucose, Bld: 77 mg/dL (ref 65–99)
Potassium: 4.1 mmol/L (ref 3.5–5.3)
Sodium: 139 mmol/L (ref 135–146)
Total Bilirubin: 0.4 mg/dL (ref 0.2–1.2)
Total Protein: 7.1 g/dL (ref 6.1–8.1)

## 2019-07-26 LAB — HEPATITIS B DNA, ULTRAQUANTITATIVE, PCR
Hepatitis B DNA (Calc): <1 Log IU/mL
Hepatitis B DNA: <10 IU/mL

## 2019-07-26 LAB — HEPATITIS B E ANTIBODY: Hep B E Ab: REACTIVE — AB

## 2019-08-06 ENCOUNTER — Ambulatory Visit (INDEPENDENT_AMBULATORY_CARE_PROVIDER_SITE_OTHER): Payer: No Typology Code available for payment source | Admitting: Internal Medicine

## 2019-08-06 ENCOUNTER — Other Ambulatory Visit: Payer: Self-pay

## 2019-08-06 DIAGNOSIS — B181 Chronic viral hepatitis B without delta-agent: Secondary | ICD-10-CM | POA: Diagnosis not present

## 2019-08-06 MED ORDER — VEMLIDY 25 MG PO TABS: 25.0000 mg | ORAL_TABLET | Freq: Every day | ORAL | 11 refills | Status: DC

## 2019-08-06 NOTE — Assessment & Plan Note (Signed)
Her chronic hepatitis B remains under perfect control.  She will continue her Vemlidy and follow-up after lab work in 6 months.

## 2019-08-06 NOTE — Progress Notes (Signed)
[facility]  Patient Active Problem List   Diagnosis Date Noted  . Chronic hepatitis B without delta agent without cirrhosis (HCC) 05/10/2016    Priority: High  . Latent tuberculosis by blood test 05/10/2016  . Seasonal allergies 05/10/2016  . Microcytic anemia 05/10/2016  . Thrombocytopenia (HCC) 05/10/2016    Patient's Medications  New Prescriptions   No medications on file  Previous Medications   CALCIUM-VITAMIN D (OSCAL WITH D) 250-125 MG-UNIT TABLET    Take 1 tablet by mouth daily.   FERROUS SULFATE 325 (65 FE) MG EC TABLET    Take 325 mg by mouth 3 (three) times daily with meals.   IBUPROFEN (ADVIL,MOTRIN) 400 MG TABLET    Take 400 mg by mouth.   LINACLOTIDE (LINZESS) 145 MCG CAPS CAPSULE    Take by mouth.  Modified Medications   Modified Medication Previous Medication   TENOFOVIR ALAFENAMIDE FUMARATE (VEMLIDY) 25 MG TABS Tenofovir Alafenamide Fumarate (VEMLIDY) 25 MG TABS      Take 1 tablet (25 mg total) by mouth daily.    Take 1 tablet (25 mg total) by mouth daily.  Discontinued Medications   No medications on file    Subjective: Christine Mccarty is in for her routine hepatitis B follow-up. She has had no problems obtaining, taking or tolerating her Vemlidy.  She does not recall missing any doses she is feeling well.  She is feeling well and has no current concerns about her health.  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  She has not received her Covid vaccine yet.  Review of Systems: Review of Systems  Constitutional: Negative for chills, diaphoresis, fever and weight loss.  Gastrointestinal: Negative for abdominal pain, heartburn, nausea and vomiting.    Past Medical History:  Diagnosis Date  . Anemia   . Arthritis   . Family history of adverse reaction to anesthesia    Pt Cousin was confused and a little agitated  . Gallstones   . Heart murmur    mild heart murmur in past  . Hepatitis    Hep B  . TB lung, latent     Social History   Tobacco Use  . Smoking status: Never Smoker  . Smokeless tobacco: Never Used  Substance Use Topics  . Alcohol use: Yes    Alcohol/week: 1.0 standard drinks    Types: 1 Standard drinks or equivalent per week    Comment: rare social drinker  . Drug use: No    Family History  Problem Relation Age of Onset  . Hypertension Mother   . Arthritis Mother   . Hepatitis B Mother     Allergies  Allergen Reactions  . Prednisone     Other reaction(s): Other (See Comments) Causes a flare in Hep. B    Objective: Vitals:   08/06/19 0847  BP: 121/72  Pulse: 68  Weight: 203 lb (92.1 kg)   Body mass index is 30.87 kg/m.  Physical Exam  Constitutional: She is oriented to person, place, and time.  She is in good spirits as usual.  HENT:  Mouth/Throat: No oropharyngeal exudate.  Cardiovascular: Normal rate and regular rhythm.  No murmur heard. Pulmonary/Chest: Effort normal and breath sounds normal.  Abdominal: Soft. She exhibits no distension. There is no abdominal tenderness.  Neurological: She is alert and oriented to person, place, and time.  Psychiatric: Mood and affect normal.    Lab Results CMP  Component Value Date/Time   NA 139 07/24/2019 1022   K 4.1 07/24/2019 1022   CL 104 07/24/2019 1022   CO2 29 07/24/2019 1022   GLUCOSE 77 07/24/2019 1022   BUN 13 07/24/2019 1022   CREATININE 1.06 07/24/2019 1022   CALCIUM 9.7 07/24/2019 1022   PROT 7.1 07/24/2019 1022   ALBUMIN 4.0 01/16/2017 1005   AST 13 07/24/2019 1022   ALT 7 07/24/2019 1022   ALT 7 05/10/2016 0950   ALKPHOS 80 01/16/2017 1005   BILITOT 0.4 07/24/2019 1022   GFRNONAA 72 11/20/2018 0849   GFRAA 83 11/20/2018 0849  Hepatitis B e antigen 07/24/2019: Negative Hepatitis B e antibody 07/24/2019: Positive Hepatitis B DNA viral load 07/24/2019: Less than 10    Problem List Items Addressed This Visit      High   Chronic hepatitis B without delta agent  without cirrhosis ([place])    Her chronic hepatitis B remains under perfect control.  She will continue her Vemlidy and follow-up after lab work in 6 months.      Relevant Medications   Tenofovir Alafenamide Fumarate (VEMLIDY) 25 MG TABS   Other Relevant Orders   Hepatitis B DNA, ultraquantitative, PCR   Hepatitis B e antibody   Hepatitis B e antigen   Comprehensive metabolic panel       Michel Bickers, MD [facility] [place] [phone] pager   [phone] cell 08/06/2019, 9:00 AM

## 2019-10-28 ENCOUNTER — Telehealth: Payer: Self-pay

## 2019-10-28 NOTE — Telephone Encounter (Signed)
New prior authorization sent to [facility] for [facility] 25mg  through CoverMyMeds. Awaiting response.  Kendrah Lovern , RN

## 2019-11-06 NOTE — Telephone Encounter (Signed)
[facility] Patient Advocate Encounter  Prior Authorization for Christine Mccarty has been approved.    Effective until 12/30/2019  Must be filled at a specialty pharmacy [phone]  Beulah Gandy, CPhT Specialty Pharmacy Patient [facility] for Infectious Disease Phone: [phone] Fax: [phone] 11/06/2019 4:26 PM

## 2020-02-06 ENCOUNTER — Other Ambulatory Visit: Payer: No Typology Code available for payment source

## 2020-02-06 ENCOUNTER — Other Ambulatory Visit: Payer: Self-pay

## 2020-02-06 DIAGNOSIS — B181 Chronic viral hepatitis B without delta-agent: Secondary | ICD-10-CM

## 2020-02-08 LAB — COMPREHENSIVE METABOLIC PANEL
AG Ratio: 1.5 (calc) (ref 1.0–2.5)
ALT: 10 U/L (ref 6–29)
AST: 15 U/L (ref 10–35)
Albumin: 4.3 g/dL (ref 3.6–5.1)
Alkaline phosphatase (APISO): 102 U/L (ref 31–125)
BUN: 13 mg/dL (ref 7–25)
CO2: 25 mmol/L (ref 20–32)
Calcium: 9.3 mg/dL (ref 8.6–10.2)
Chloride: 106 mmol/L (ref 98–110)
Creat: 0.99 mg/dL (ref 0.50–1.10)
Globulin: 2.8 g/dL (calc) (ref 1.9–3.7)
Glucose, Bld: 72 mg/dL (ref 65–99)
Potassium: 4.7 mmol/L (ref 3.5–5.3)
Sodium: 137 mmol/L (ref 135–146)
Total Bilirubin: 0.3 mg/dL (ref 0.2–1.2)
Total Protein: 7.1 g/dL (ref 6.1–8.1)

## 2020-02-08 LAB — HEPATITIS B E ANTIGEN: Hep B E Ag: NONREACTIVE

## 2020-02-08 LAB — HEPATITIS B E ANTIBODY: Hep B E Ab: REACTIVE — AB

## 2020-02-08 LAB — HEPATITIS B DNA, ULTRAQUANTITATIVE, PCR
Hepatitis B DNA (Calc): <1 Log IU/mL
Hepatitis B DNA: <10 IU/mL

## 2020-02-20 ENCOUNTER — Telehealth (INDEPENDENT_AMBULATORY_CARE_PROVIDER_SITE_OTHER): Payer: No Typology Code available for payment source | Admitting: Internal Medicine

## 2020-02-20 ENCOUNTER — Telehealth: Payer: Self-pay | Admitting: Pharmacy Technician

## 2020-02-20 ENCOUNTER — Other Ambulatory Visit: Payer: Self-pay

## 2020-02-20 DIAGNOSIS — B181 Chronic viral hepatitis B without delta-agent: Secondary | ICD-10-CM

## 2020-02-20 MED ORDER — VEMLIDY 25 MG PO TABS: 25.0000 mg | ORAL_TABLET | Freq: Every day | ORAL | 3 refills | Status: DC

## 2020-02-20 NOTE — Telephone Encounter (Signed)
[facility] Patient Advocate Encounter  Patient expressed having issues getting Vemlidy from mail order pharmacy today with her provider, who let me know.  I called her mail order pharmacy and they said there was no issue.  I asked if there is a copay owed and [place] stated, no, there is a copay card on her profile.  When I called the patient she stated that they held up delivery stating there was a past copay amount due.  The patient and I agreed that if she has issues again for her to call me, letting me know what they are stating and the phone number for me to call.  I will try to help in any way I can.  Patient understands and is appreciative.   Netty Starring. Dimas Aguas CPhT Specialty Pharmacy Patient [facility] for Infectious Disease Phone: [phone] Fax:  [phone]

## 2020-02-20 NOTE — Progress Notes (Signed)
Virtual Visit via Telephone Note  I connected with Christine Mccarty on 02/20/20 at  8:45 AM EDT by telephone and verified that I am speaking with the correct person using two identifiers.  Location: Patient: Home Provider: [facility]   I discussed the limitations, risks, security and privacy concerns of performing an evaluation and management service by telephone and the availability of in person appointments. I also discussed with the patient that there may be a patient responsible charge related to this service. The patient expressed understanding and agreed to proceed.   History of Present Illness: I called and spoke with Christine Mccarty this morning.  She tells me that she has had some difficulty with her insurance company.  I have consistently been late filling her prescription causing her to miss 4 days recently.  She is feeling well.   Observations/Objective: Iab work 02/06/2020 Liver enzymes normal Hepatitis B e antigen negative Hepatitis B e antibody positive Hepatitis B DNA viral load less than 10  Assessment and Plan: Her chronic hepatitis B remains in remission.  We will call her pharmacy to clarify why they have been late filling her Vemlidy.  I have given her 90-day refills.  Follow Up Instructions: Continue Vemlidy Follow-up after lab work in 6 months   I discussed the assessment and treatment plan with the patient. The patient was provided an opportunity to ask questions and all were answered. The patient agreed with the plan and demonstrated an understanding of the instructions.   The patient was advised to call back or seek an in-person evaluation if the symptoms worsen or if the condition fails to improve as anticipated.  I provided 15 minutes of non-face-to-face time during this encounter.   Cliffton Asters, MD

## 2020-03-03 ENCOUNTER — Telehealth: Payer: Self-pay

## 2020-03-03 NOTE — Telephone Encounter (Signed)
Received notice that [facility] has had difficulty reaching patient for [facility] delivery. Patient was contacted and stated only 1 month was sent despite 3 months being prescribed. Spoke with Randa Evens at [facility] who informed us that only 1 month refills are covered by her insurance. Randa Evens will be contacting patient to set up auto-refills to ensure that there are no missed doses. Will follow up to ensure successful set up.  Naje Rice Loyola Mast, RN

## 2020-08-19 ENCOUNTER — Other Ambulatory Visit: Payer: No Typology Code available for payment source

## 2020-08-25 ENCOUNTER — Other Ambulatory Visit: Payer: No Typology Code available for payment source

## 2020-09-02 ENCOUNTER — Ambulatory Visit: Payer: No Typology Code available for payment source | Admitting: Internal Medicine

## 2020-09-04 ENCOUNTER — Other Ambulatory Visit: Payer: No Typology Code available for payment source

## 2020-09-04 ENCOUNTER — Other Ambulatory Visit: Payer: Self-pay

## 2020-09-04 DIAGNOSIS — B181 Chronic viral hepatitis B without delta-agent: Secondary | ICD-10-CM

## 2020-09-07 LAB — COMPREHENSIVE METABOLIC PANEL
AG Ratio: 1.5 (calc) (ref 1.0–2.5)
ALT: 8 U/L (ref 6–29)
AST: 15 U/L (ref 10–35)
Albumin: 4.2 g/dL (ref 3.6–5.1)
Alkaline phosphatase (APISO): 106 U/L (ref 31–125)
BUN: 15 mg/dL (ref 7–25)
CO2: 24 mmol/L (ref 20–32)
Calcium: 9.2 mg/dL (ref 8.6–10.2)
Chloride: 104 mmol/L (ref 98–110)
Creat: 1 mg/dL (ref 0.50–1.10)
Globulin: 2.8 g/dL (calc) (ref 1.9–3.7)
Glucose, Bld: 84 mg/dL (ref 65–99)
Potassium: 4.5 mmol/L (ref 3.5–5.3)
Sodium: 138 mmol/L (ref 135–146)
Total Bilirubin: 0.5 mg/dL (ref 0.2–1.2)
Total Protein: 7 g/dL (ref 6.1–8.1)

## 2020-09-07 LAB — HEPATITIS B E ANTIBODY: Hep B E Ab: REACTIVE — AB

## 2020-09-07 LAB — HEPATITIS B DNA, ULTRAQUANTITATIVE, PCR
Hepatitis B DNA (Calc): <1 Log IU/mL
Hepatitis B DNA: <10 IU/mL

## 2020-09-07 LAB — HEPATITIS B E ANTIGEN: Hep B E Ag: NONREACTIVE

## 2020-09-16 ENCOUNTER — Encounter: Payer: No Typology Code available for payment source | Admitting: Internal Medicine

## 2020-09-17 ENCOUNTER — Telehealth (INDEPENDENT_AMBULATORY_CARE_PROVIDER_SITE_OTHER): Payer: 59 | Admitting: Internal Medicine

## 2020-09-17 ENCOUNTER — Other Ambulatory Visit (HOSPITAL_COMMUNITY): Payer: Self-pay

## 2020-09-17 ENCOUNTER — Encounter: Payer: Self-pay | Admitting: Internal Medicine

## 2020-09-17 ENCOUNTER — Telehealth: Payer: Self-pay

## 2020-09-17 ENCOUNTER — Encounter: Payer: No Typology Code available for payment source | Admitting: Internal Medicine

## 2020-09-17 ENCOUNTER — Other Ambulatory Visit: Payer: Self-pay

## 2020-09-17 DIAGNOSIS — B181 Chronic viral hepatitis B without delta-agent: Secondary | ICD-10-CM

## 2020-09-17 MED ORDER — VEMLIDY 25 MG PO TABS
25.0000 mg | ORAL_TABLET | Freq: Every day | ORAL | 3 refills | Status: DC
  Filled 2020-09-17: qty 30, 30d supply, fill #0
  Filled 2020-09-17 (×4): qty 90, 90d supply, fill #0
  Filled 2020-09-17: qty 30, 30d supply, fill #0
  Filled 2020-10-12: qty 30, 30d supply, fill #1
  Filled 2021-01-21: qty 30, 30d supply, fill #2
  Filled 2021-02-19: qty 30, 30d supply, fill #3
  Filled 2021-03-19: qty 30, 30d supply, fill #4

## 2020-09-17 NOTE — Progress Notes (Signed)
Virtual Visit via Telephone Note  I connected with Christine Mccarty on 09/17/20 at  9:00 AM EDT by telephone and verified that I am speaking with the correct person using two identifiers.  Location: Patient: Home Provider: [facility]   I discussed the limitations, risks, security and privacy concerns of performing an evaluation and management service by telephone and the availability of in person appointments. I also discussed with the patient that there may be a patient responsible charge related to this service. The patient expressed understanding and agreed to proceed.   History of Present Illness: I spoke to Christine Mccarty by phone today.  She is doing very well and has not had any problems obtaining, taking or tolerating her Vemlidy.  She thinks she has only missed about 2 times in the past 6 months.  She just changed her insurance and is worried whether or not they will cover her Vemlidy.  She is feeling well.   Observations/Objective: CMP     Component Value Date/Time   NA 138 09/04/2020 0953   K 4.5 09/04/2020 0953   CL 104 09/04/2020 0953   CO2 24 09/04/2020 0953   GLUCOSE 84 09/04/2020 0953   BUN 15 09/04/2020 0953   CREATININE 1.00 09/04/2020 0953   CALCIUM 9.2 09/04/2020 0953   PROT 7.0 09/04/2020 0953   ALBUMIN 4.0 01/16/2017 1005   AST 15 09/04/2020 0953   ALT 8 09/04/2020 0953   ALT 7 05/10/2016 0950   ALKPHOS 80 01/16/2017 1005   BILITOT 0.5 09/04/2020 0953   GFRNONAA 72 11/20/2018 0849   GFRAA 83 11/20/2018 0849  Hepatitis B E antigen negative hepatitis B E antibody + 09/04/2020 Hepatitis B viral load 09/04/2020 less than 10  Assessment and Plan: She has had excellent, long-term suppression of her hepatitis B.  We will check with your insurance to make sure Christine Mccarty remains covered.  Follow Up Instructions: Continue Vemlidy Follow-up in 6 months   I discussed the assessment and treatment plan with the patient. The patient was provided an opportunity to ask questions and all  were answered. The patient agreed with the plan and demonstrated an understanding of the instructions.   The patient was advised to call back or seek an in-person evaluation if the symptoms worsen or if the condition fails to improve as anticipated.  I provided 15 minutes of non-face-to-face time during this encounter.   Cliffton Asters, MD

## 2020-09-17 NOTE — Telephone Encounter (Signed)
[facility] Patient Advocate Encounter °  °Was successful in obtaining a [employer] copay card for Vemlidy.  This copay card will make the patients copay 0.00. ° °I have spoken with the patient.   ° °The billing information is as follows and has been shared with [facility]. ° ° ° ° ° °Perry Brucato, CPhT °Specialty Pharmacy Patient Advocate °[facility] °Phone: [phone] °Fax:  [phone]  °

## 2020-09-18 ENCOUNTER — Other Ambulatory Visit (HOSPITAL_COMMUNITY): Payer: Self-pay

## 2020-10-12 ENCOUNTER — Other Ambulatory Visit (HOSPITAL_COMMUNITY): Payer: Self-pay

## 2020-11-10 ENCOUNTER — Other Ambulatory Visit (HOSPITAL_COMMUNITY): Payer: Self-pay

## 2020-11-17 DIAGNOSIS — E559 Vitamin D deficiency, unspecified: Secondary | ICD-10-CM | POA: Diagnosis not present

## 2020-11-17 DIAGNOSIS — E7800 Pure hypercholesterolemia, unspecified: Secondary | ICD-10-CM | POA: Diagnosis not present

## 2020-11-17 DIAGNOSIS — Z0000 Encounter for general adult medical examination without abnormal findings: Secondary | ICD-10-CM | POA: Diagnosis not present

## 2020-11-17 DIAGNOSIS — Z8249 Family history of ischemic heart disease and other diseases of the circulatory system: Secondary | ICD-10-CM | POA: Diagnosis not present

## 2020-11-17 DIAGNOSIS — Z0001 Encounter for general adult medical examination with abnormal findings: Secondary | ICD-10-CM | POA: Diagnosis not present

## 2020-11-25 ENCOUNTER — Other Ambulatory Visit (HOSPITAL_COMMUNITY): Payer: Self-pay

## 2020-11-25 MED ORDER — MELOXICAM 15 MG PO TABS
1.0000 | ORAL_TABLET | Freq: Every day | ORAL | 2 refills | Status: DC
  Filled 2020-11-25: qty 30, 30d supply, fill #0

## 2020-11-27 ENCOUNTER — Other Ambulatory Visit (HOSPITAL_COMMUNITY): Payer: Self-pay

## 2020-12-01 DIAGNOSIS — D696 Thrombocytopenia, unspecified: Secondary | ICD-10-CM | POA: Diagnosis not present

## 2020-12-08 ENCOUNTER — Other Ambulatory Visit (HOSPITAL_COMMUNITY): Payer: Self-pay

## 2020-12-09 ENCOUNTER — Other Ambulatory Visit (HOSPITAL_COMMUNITY): Payer: Self-pay

## 2020-12-22 ENCOUNTER — Other Ambulatory Visit (HOSPITAL_COMMUNITY): Payer: Self-pay

## 2021-01-19 ENCOUNTER — Telehealth: Payer: Self-pay

## 2021-01-19 ENCOUNTER — Other Ambulatory Visit (HOSPITAL_COMMUNITY): Payer: Self-pay

## 2021-01-19 NOTE — Telephone Encounter (Signed)
Received faxed notification from [facility] that they have had difficulty contacting patient for [facility] refills. RN called patient, no answer. Left HIPAA compliant voicemail requesting callback.   Sandie Ano, RN

## 2021-01-21 ENCOUNTER — Other Ambulatory Visit (HOSPITAL_COMMUNITY): Payer: Self-pay

## 2021-01-26 ENCOUNTER — Other Ambulatory Visit (HOSPITAL_COMMUNITY): Payer: Self-pay

## 2021-02-17 ENCOUNTER — Other Ambulatory Visit (HOSPITAL_COMMUNITY): Payer: Self-pay

## 2021-02-19 ENCOUNTER — Other Ambulatory Visit (HOSPITAL_COMMUNITY): Payer: Self-pay

## 2021-02-23 ENCOUNTER — Other Ambulatory Visit (HOSPITAL_COMMUNITY): Payer: Self-pay

## 2021-03-19 ENCOUNTER — Other Ambulatory Visit (HOSPITAL_COMMUNITY): Payer: Self-pay

## 2021-03-22 ENCOUNTER — Other Ambulatory Visit (HOSPITAL_COMMUNITY): Payer: Self-pay

## 2021-03-31 ENCOUNTER — Other Ambulatory Visit (HOSPITAL_COMMUNITY): Payer: Self-pay

## 2021-03-31 ENCOUNTER — Other Ambulatory Visit: Payer: Self-pay

## 2021-03-31 ENCOUNTER — Ambulatory Visit: Payer: 59 | Admitting: Internal Medicine

## 2021-03-31 ENCOUNTER — Ambulatory Visit (INDEPENDENT_AMBULATORY_CARE_PROVIDER_SITE_OTHER): Payer: 59

## 2021-03-31 ENCOUNTER — Encounter: Payer: Self-pay | Admitting: Internal Medicine

## 2021-03-31 DIAGNOSIS — B181 Chronic viral hepatitis B without delta-agent: Secondary | ICD-10-CM | POA: Diagnosis not present

## 2021-03-31 DIAGNOSIS — Z23 Encounter for immunization: Secondary | ICD-10-CM | POA: Diagnosis not present

## 2021-03-31 MED ORDER — VEMLIDY 25 MG PO TABS
25.0000 mg | ORAL_TABLET | Freq: Every day | ORAL | 3 refills | Status: DC
  Filled 2021-03-31: qty 90, 90d supply, fill #0
  Filled 2021-04-15: qty 30, 30d supply, fill #0
  Filled 2021-05-14: qty 30, 30d supply, fill #1
  Filled 2021-06-10: qty 30, 30d supply, fill #2
  Filled 2021-07-07: qty 30, 30d supply, fill #3
  Filled 2021-08-13: qty 30, 30d supply, fill #4
  Filled 2021-09-06: qty 30, 30d supply, fill #5
  Filled 2021-10-01: qty 30, 30d supply, fill #6
  Filled 2021-10-29: qty 30, 30d supply, fill #7
  Filled 2021-12-21: qty 30, 30d supply, fill #8
  Filled 2022-02-07: qty 30, 30d supply, fill #9
  Filled 2022-03-03: qty 30, 30d supply, fill #10

## 2021-03-31 NOTE — Assessment & Plan Note (Signed)
Her chronic hepatitis B has been under excellent, long-term control since she was first diagnosed and started treatment in [place] in 2009.  She will continue Vemlidy and follow-up after repeat lab work in 6 months.

## 2021-03-31 NOTE — Progress Notes (Signed)
   Covid-19 Vaccination Clinic  Name:  Christine Mccarty    MRN: [id number] DOB: [birth year]  03/31/2021  [name] was observed post Covid-19 immunization for 15 minutes without incident. She was provided with Vaccine Information Sheet and instruction to access the V-Safe system.   [name] was instructed to call 911 with any severe reactions post vaccine: Difficulty breathing  Swelling of face and throat  A fast heartbeat  A bad rash all over body  Dizziness and weakness   Immunizations Administered     Name Date Dose VIS Date Route   Pfizer Covid-19 Vaccine Bivalent Booster 03/31/2021  9:20 AM 0.3 mL 02/10/2021 Intramuscular   Manufacturer: [employer], [employer]   Lot: [id number]   NDC: [address]-5      Andree Coss, RN

## 2021-03-31 NOTE — Progress Notes (Signed)
[facility]  Patient Active Problem List   Diagnosis Date Noted   Chronic hepatitis B without delta agent without cirrhosis (HCC) 05/10/2016    Priority: 1.   Latent tuberculosis by blood test 05/10/2016   Seasonal allergies 05/10/2016   Microcytic anemia 05/10/2016   Thrombocytopenia (HCC) 05/10/2016    Patient's Medications  New Prescriptions   No medications on file  Previous Medications   CALCIUM-VITAMIN D (OSCAL WITH D) 250-125 MG-UNIT TABLET    Take 1 tablet by mouth daily.   FERROUS SULFATE 325 (65 FE) MG EC TABLET    Take 325 mg by mouth 3 (three) times daily with meals.   IBUPROFEN (ADVIL,MOTRIN) 400 MG TABLET    Take 400 mg by mouth.   MELOXICAM (MOBIC) 15 MG TABLET    Take 1 tablet (15 mg total) by mouth daily.   VITAMIN D, ERGOCALCIFEROL, (DRISDOL) 1.25 MG (50000 UNIT) CAPS CAPSULE    Take 50,000 Units by mouth once a week.  Modified Medications   Modified Medication Previous Medication   TENOFOVIR ALAFENAMIDE FUMARATE (VEMLIDY) 25 MG TABS Tenofovir Alafenamide Fumarate (VEMLIDY) 25 MG TABS      Take 1 tablet (25 mg total) by mouth daily.    Take 1 tablet (25 mg total) by mouth daily.  Discontinued Medications   LINACLOTIDE (LINZESS) 145 MCG CAPS CAPSULE    Take by mouth.    Subjective: Christine Mccarty is in for her routine hepatitis B follow-up.  She denies any problems obtaining, taking or tolerating her Vemlidy.  She has missed only 1 dose in the past 6 months when she got to work without taking it.  She is feeling well.  Review of Systems: Review of Systems  Constitutional:  Negative for fever and weight loss.  Gastrointestinal:  Negative for abdominal pain, diarrhea, nausea and vomiting.   Past Medical History:  Diagnosis Date   Anemia    Arthritis    Family history of adverse reaction to anesthesia    Pt Cousin was confused and a little agitated   Gallstones    Heart murmur    mild heart murmur in past   Hepatitis    Hep B    TB lung, latent     Social History   Tobacco Use   Smoking status: Never   Smokeless tobacco: Never  Substance Use Topics   Alcohol use: Yes    Alcohol/week: 1.0 standard drink    Types: 1 Standard drinks or equivalent per week    Comment: rare social drinker   Drug use: No    Family History  Problem Relation Age of Onset   Hypertension Mother    Arthritis Mother    Hepatitis B Mother     Allergies  Allergen Reactions   Prednisone     Other reaction(s): Other (See Comments) Causes a flare in Hep. B    Objective: Vitals:   03/31/21 0901  BP: 112/72  Pulse: 62  Temp: 98.2 F (36.8 C)  TempSrc: Oral  Weight: 210 lb (95.3 kg)   Body mass index is 31.93 kg/m.  Physical Exam Constitutional:      Comments: She is in very good spirits as usual.  Cardiovascular:     Rate and Rhythm: Normal rate.  Pulmonary:     Effort: Pulmonary effort is normal.  Abdominal:     Palpations: Abdomen is soft. There is no mass.     Tenderness: There is no abdominal  tenderness.  Psychiatric:        Mood and Affect: Mood normal.    Lab Results 09/04/2020 LFTs normal Hepatitis B e antigen negative, e antibody positive Hepatitis B DNA viral load less than 10   Problem List Items Addressed This Visit       1.   Chronic hepatitis B without delta agent without cirrhosis (HCC)    Her chronic hepatitis B has been under excellent, long-term control since she was first diagnosed and started treatment in [place] in 2009.  She will continue Vemlidy and follow-up after repeat lab work in 6 months.      Relevant Medications   Tenofovir Alafenamide Fumarate (VEMLIDY) 25 MG TABS   Other Relevant Orders   Comprehensive metabolic panel   Hepatitis B DNA, ultraquantitative, PCR   Hepatitis B e antibody   Hepatitis B e antigen     Cliffton Asters, MD [facility] [phone] pager   [phone] cell 03/31/2021, 9:18 AM

## 2021-04-15 ENCOUNTER — Other Ambulatory Visit (HOSPITAL_COMMUNITY): Payer: Self-pay

## 2021-04-16 ENCOUNTER — Other Ambulatory Visit (HOSPITAL_COMMUNITY): Payer: Self-pay

## 2021-05-13 ENCOUNTER — Other Ambulatory Visit (HOSPITAL_COMMUNITY): Payer: Self-pay

## 2021-05-14 ENCOUNTER — Other Ambulatory Visit (HOSPITAL_COMMUNITY): Payer: Self-pay

## 2021-06-08 ENCOUNTER — Other Ambulatory Visit (HOSPITAL_COMMUNITY): Payer: Self-pay

## 2021-06-10 ENCOUNTER — Other Ambulatory Visit (HOSPITAL_COMMUNITY): Payer: Self-pay

## 2021-07-05 ENCOUNTER — Other Ambulatory Visit (HOSPITAL_COMMUNITY): Payer: Self-pay

## 2021-07-07 ENCOUNTER — Other Ambulatory Visit (HOSPITAL_COMMUNITY): Payer: Self-pay

## 2021-07-29 ENCOUNTER — Other Ambulatory Visit (HOSPITAL_COMMUNITY): Payer: Self-pay

## 2021-08-02 ENCOUNTER — Other Ambulatory Visit (HOSPITAL_COMMUNITY): Payer: Self-pay

## 2021-08-04 ENCOUNTER — Other Ambulatory Visit (HOSPITAL_COMMUNITY): Payer: Self-pay

## 2021-08-13 ENCOUNTER — Other Ambulatory Visit (HOSPITAL_COMMUNITY): Payer: Self-pay

## 2021-09-06 ENCOUNTER — Other Ambulatory Visit (HOSPITAL_COMMUNITY): Payer: Self-pay

## 2021-09-07 ENCOUNTER — Other Ambulatory Visit (HOSPITAL_COMMUNITY): Payer: Self-pay

## 2021-09-14 ENCOUNTER — Other Ambulatory Visit (HOSPITAL_COMMUNITY): Payer: Self-pay

## 2021-09-28 ENCOUNTER — Other Ambulatory Visit (HOSPITAL_COMMUNITY): Payer: Self-pay

## 2021-10-01 ENCOUNTER — Other Ambulatory Visit (HOSPITAL_COMMUNITY): Payer: Self-pay

## 2021-10-04 ENCOUNTER — Other Ambulatory Visit (HOSPITAL_COMMUNITY): Payer: Self-pay

## 2021-10-08 ENCOUNTER — Other Ambulatory Visit: Payer: Self-pay

## 2021-10-08 ENCOUNTER — Other Ambulatory Visit: Payer: 59

## 2021-10-08 DIAGNOSIS — B181 Chronic viral hepatitis B without delta-agent: Secondary | ICD-10-CM | POA: Diagnosis not present

## 2021-10-12 LAB — HEPATITIS B DNA, ULTRAQUANTITATIVE, PCR
Hepatitis B DNA (Calc): <1 Log IU/mL
Hepatitis B DNA: <10 IU/mL

## 2021-10-12 LAB — COMPREHENSIVE METABOLIC PANEL
AG Ratio: 1.6 (calc) (ref 1.0–2.5)
ALT: 9 U/L (ref 6–29)
AST: 15 U/L (ref 10–35)
Albumin: 4.5 g/dL (ref 3.6–5.1)
Alkaline phosphatase (APISO): 92 U/L (ref 31–125)
BUN: 19 mg/dL (ref 7–25)
CO2: 26 mmol/L (ref 20–32)
Calcium: 9.5 mg/dL (ref 8.6–10.2)
Chloride: 108 mmol/L (ref 98–110)
Creat: 0.94 mg/dL (ref 0.50–0.99)
Globulin: 2.8 g/dL (calc) (ref 1.9–3.7)
Glucose, Bld: 94 mg/dL (ref 65–99)
Potassium: 4.8 mmol/L (ref 3.5–5.3)
Sodium: 139 mmol/L (ref 135–146)
Total Bilirubin: 0.4 mg/dL (ref 0.2–1.2)
Total Protein: 7.3 g/dL (ref 6.1–8.1)

## 2021-10-12 LAB — HEPATITIS B E ANTIGEN: Hep B E Ag: NONREACTIVE

## 2021-10-12 LAB — HEPATITIS B E ANTIBODY: Hep B E Ab: REACTIVE — AB

## 2021-10-21 ENCOUNTER — Other Ambulatory Visit: Payer: Self-pay

## 2021-10-21 ENCOUNTER — Encounter: Payer: Self-pay | Admitting: Internal Medicine

## 2021-10-21 ENCOUNTER — Ambulatory Visit (INDEPENDENT_AMBULATORY_CARE_PROVIDER_SITE_OTHER): Payer: 59 | Admitting: Internal Medicine

## 2021-10-21 DIAGNOSIS — B181 Chronic viral hepatitis B without delta-agent: Secondary | ICD-10-CM | POA: Diagnosis not present

## 2021-10-21 NOTE — Progress Notes (Signed)
Virtual Visit via Telephone Note ? ?I connected with [name] on 10/21/21 at  9:15 AM EDT by telephone and verified that I am speaking with the correct person using two identifiers. ? ?Location: ?Patient: Home ?Provider: [facility] ?  ?I discussed the limitations, risks, security and privacy concerns of performing an evaluation and management service by telephone and the availability of in person appointments. I also discussed with the patient that there may be a patient responsible charge related to this service. The patient expressed understanding and agreed to proceed. ? ? ?History of Present Illness: ?I called and spoke with Christine Mccarty today.  She has not had any problems obtaining, taking or tolerating Vemlidy and says that she does her best to never miss a dose.  She is feeling well. ?  ?Observations/Objective: ?CMP  ?   ?Component Value Date/Time  ? NA 139 10/08/2021 0951  ? K 4.8 10/08/2021 0951  ? CL 108 10/08/2021 0951  ? CO2 26 10/08/2021 0951  ? GLUCOSE 94 10/08/2021 0951  ? BUN 19 10/08/2021 0951  ? CREATININE 0.94 10/08/2021 0951  ? CALCIUM 9.5 10/08/2021 0951  ? PROT 7.3 10/08/2021 0951  ? ALBUMIN 4.0 01/16/2017 1005  ? AST 15 10/08/2021 0951  ? ALT 9 10/08/2021 0951  ? ALT 7 05/10/2016 0950  ? ALKPHOS 80 01/16/2017 1005  ? BILITOT 0.4 10/08/2021 0951  ? GFRNONAA 72 11/20/2018 0849  ? GFRAA 83 11/20/2018 0849  ?Hepatitis B e AG 10/03/2021 positive ?Hepatitis B DNA viral load 10/03/2021 less than 10 ? ?Assessment and Plan: ?[name] has chronic, e antigen positive hepatitis B that was diagnosed in [place] in 2009.  I do not have any of her baseline lab results.  She was started on therapy shortly after diagnosis and has had an undetectable viral load ever since. ? ?Follow Up Instructions: ?Continue Vemlidy ?Follow-up after lab work in 6 months ?  ?I discussed the assessment and treatment plan with the patient. The patient was provided an opportunity to ask questions and all were answered. The patient agreed  with the plan and demonstrated an understanding of the instructions. ?  ?The patient was advised to call back or seek an in-person evaluation if the symptoms worsen or if the condition fails to improve as anticipated. ? ?I provided 14 minutes of non-face-to-face time during this encounter. ? ? ?Cliffton Asters, MD ? ?

## 2021-10-27 ENCOUNTER — Other Ambulatory Visit (HOSPITAL_COMMUNITY): Payer: Self-pay

## 2021-10-29 ENCOUNTER — Other Ambulatory Visit (HOSPITAL_COMMUNITY): Payer: Self-pay

## 2021-11-04 ENCOUNTER — Other Ambulatory Visit (HOSPITAL_COMMUNITY): Payer: Self-pay

## 2021-11-30 ENCOUNTER — Other Ambulatory Visit (HOSPITAL_COMMUNITY): Payer: Self-pay

## 2021-12-02 ENCOUNTER — Other Ambulatory Visit (HOSPITAL_COMMUNITY): Payer: Self-pay

## 2021-12-06 ENCOUNTER — Other Ambulatory Visit (HOSPITAL_COMMUNITY): Payer: Self-pay

## 2021-12-21 ENCOUNTER — Other Ambulatory Visit (HOSPITAL_COMMUNITY): Payer: Self-pay

## 2021-12-29 ENCOUNTER — Other Ambulatory Visit (HOSPITAL_COMMUNITY): Payer: Self-pay

## 2022-01-20 ENCOUNTER — Other Ambulatory Visit (HOSPITAL_COMMUNITY): Payer: Self-pay

## 2022-01-21 DIAGNOSIS — E669 Obesity, unspecified: Secondary | ICD-10-CM | POA: Diagnosis not present

## 2022-01-21 DIAGNOSIS — Z1211 Encounter for screening for malignant neoplasm of colon: Secondary | ICD-10-CM | POA: Diagnosis not present

## 2022-01-21 DIAGNOSIS — D61818 Other pancytopenia: Secondary | ICD-10-CM | POA: Diagnosis not present

## 2022-01-21 DIAGNOSIS — Z13228 Encounter for screening for other metabolic disorders: Secondary | ICD-10-CM | POA: Diagnosis not present

## 2022-01-21 DIAGNOSIS — Z6832 Body mass index (BMI) 32.0-32.9, adult: Secondary | ICD-10-CM | POA: Diagnosis not present

## 2022-01-21 DIAGNOSIS — E559 Vitamin D deficiency, unspecified: Secondary | ICD-10-CM | POA: Diagnosis not present

## 2022-01-21 DIAGNOSIS — Z1329 Encounter for screening for other suspected endocrine disorder: Secondary | ICD-10-CM | POA: Diagnosis not present

## 2022-01-21 DIAGNOSIS — Z0000 Encounter for general adult medical examination without abnormal findings: Secondary | ICD-10-CM | POA: Diagnosis not present

## 2022-01-21 DIAGNOSIS — E7800 Pure hypercholesterolemia, unspecified: Secondary | ICD-10-CM | POA: Diagnosis not present

## 2022-01-24 ENCOUNTER — Other Ambulatory Visit (HOSPITAL_COMMUNITY): Payer: Self-pay

## 2022-01-26 ENCOUNTER — Other Ambulatory Visit (HOSPITAL_COMMUNITY): Payer: Self-pay

## 2022-01-27 ENCOUNTER — Telehealth: Payer: Self-pay | Admitting: Hematology and Oncology

## 2022-01-27 DIAGNOSIS — Z1231 Encounter for screening mammogram for malignant neoplasm of breast: Secondary | ICD-10-CM | POA: Diagnosis not present

## 2022-01-27 DIAGNOSIS — Z124 Encounter for screening for malignant neoplasm of cervix: Secondary | ICD-10-CM | POA: Diagnosis not present

## 2022-01-27 DIAGNOSIS — Z01419 Encounter for gynecological examination (general) (routine) without abnormal findings: Secondary | ICD-10-CM | POA: Diagnosis not present

## 2022-01-27 NOTE — Telephone Encounter (Signed)
Scheduled appt per 8/16 referral. Pt is aware of appt date and time. Pt is aware to arrive 15 mins prior to appt time and to bring and updated insurance card. Pt is aware of appt location.   

## 2022-02-07 ENCOUNTER — Other Ambulatory Visit (HOSPITAL_COMMUNITY): Payer: Self-pay

## 2022-02-07 DIAGNOSIS — B181 Chronic viral hepatitis B without delta-agent: Secondary | ICD-10-CM | POA: Diagnosis not present

## 2022-02-07 DIAGNOSIS — K5900 Constipation, unspecified: Secondary | ICD-10-CM | POA: Diagnosis not present

## 2022-02-07 DIAGNOSIS — Z1211 Encounter for screening for malignant neoplasm of colon: Secondary | ICD-10-CM | POA: Diagnosis not present

## 2022-02-21 ENCOUNTER — Inpatient Hospital Stay: Payer: 59 | Attending: Hematology and Oncology | Admitting: Hematology and Oncology

## 2022-02-21 ENCOUNTER — Inpatient Hospital Stay: Payer: 59

## 2022-02-25 ENCOUNTER — Telehealth: Payer: Self-pay | Admitting: Hematology and Oncology

## 2022-02-25 NOTE — Telephone Encounter (Signed)
R/s pt's new hem appt. Pt is aware.  

## 2022-03-01 ENCOUNTER — Other Ambulatory Visit (HOSPITAL_COMMUNITY): Payer: Self-pay

## 2022-03-03 ENCOUNTER — Other Ambulatory Visit (HOSPITAL_COMMUNITY): Payer: Self-pay

## 2022-03-09 ENCOUNTER — Other Ambulatory Visit (HOSPITAL_COMMUNITY): Payer: Self-pay

## 2022-03-23 ENCOUNTER — Other Ambulatory Visit: Payer: Self-pay

## 2022-03-23 ENCOUNTER — Inpatient Hospital Stay: Payer: 59 | Attending: Hematology and Oncology | Admitting: Hematology and Oncology

## 2022-03-23 ENCOUNTER — Inpatient Hospital Stay: Payer: 59

## 2022-03-23 VITALS — BP 118/61 | HR 74 | Temp 98.3°F | Resp 14 | Wt 205.0 lb

## 2022-03-23 DIAGNOSIS — D696 Thrombocytopenia, unspecified: Secondary | ICD-10-CM

## 2022-03-23 DIAGNOSIS — Z79624 Long term (current) use of inhibitors of nucleotide synthesis: Secondary | ICD-10-CM

## 2022-03-23 DIAGNOSIS — B181 Chronic viral hepatitis B without delta-agent: Secondary | ICD-10-CM | POA: Diagnosis not present

## 2022-03-23 DIAGNOSIS — D500 Iron deficiency anemia secondary to blood loss (chronic): Secondary | ICD-10-CM

## 2022-03-23 DIAGNOSIS — D6959 Other secondary thrombocytopenia: Secondary | ICD-10-CM

## 2022-03-23 LAB — CMP (CANCER CENTER ONLY)
ALT: 7 U/L (ref 0–44)
AST: 15 U/L (ref 15–41)
Albumin: 4.4 g/dL (ref 3.5–5.0)
Alkaline Phosphatase: 104 U/L (ref 38–126)
Anion gap: 3 — ABNORMAL LOW (ref 5–15)
BUN: 13 mg/dL (ref 6–20)
CO2: 29 mmol/L (ref 22–32)
Calcium: 9.1 mg/dL (ref 8.9–10.3)
Chloride: 106 mmol/L (ref 98–111)
Creatinine: 0.94 mg/dL (ref 0.44–1.00)
GFR, Estimated: >60 mL/min (ref >60)
Glucose, Bld: 95 mg/dL (ref 70–99)
Potassium: 4.4 mmol/L (ref 3.5–5.1)
Sodium: 138 mmol/L (ref 135–145)
Total Bilirubin: 0.3 mg/dL (ref 0.3–1.2)
Total Protein: 7.4 g/dL (ref 6.5–8.1)

## 2022-03-23 LAB — CBC WITH DIFFERENTIAL (CANCER CENTER ONLY)
Abs Immature Granulocytes: 0.01 10*3/uL (ref 0.00–0.07)
Basophils Absolute: 0 10*3/uL (ref 0.0–0.1)
Basophils Relative: 1 %
Eosinophils Absolute: 0.1 10*3/uL (ref 0.0–0.5)
Eosinophils Relative: 1 %
HCT: 38.3 % (ref 36.0–46.0)
Hemoglobin: 12.5 g/dL (ref 12.0–15.0)
Immature Granulocytes: 0 %
Lymphocytes Relative: 23 %
Lymphs Abs: 1 10*3/uL (ref 0.7–4.0)
MCH: 26.7 pg (ref 26.0–34.0)
MCHC: 32.6 g/dL (ref 30.0–36.0)
MCV: 81.8 fL (ref 80.0–100.0)
Monocytes Absolute: 0.3 10*3/uL (ref 0.1–1.0)
Monocytes Relative: 7 %
Neutro Abs: 2.9 10*3/uL (ref 1.7–7.7)
Neutrophils Relative %: 68 %
Platelet Count: 110 10*3/uL — ABNORMAL LOW (ref 150–400)
RBC: 4.68 MIL/uL (ref 3.87–5.11)
RDW: 15.3 % (ref 11.5–15.5)
WBC Count: 4.3 10*3/uL (ref 4.0–10.5)
nRBC: 0 % (ref 0.0–0.2)

## 2022-03-23 LAB — IRON AND IRON BINDING CAPACITY (CC-WL,HP ONLY)
Iron: 56 ug/dL (ref 28–170)
Saturation Ratios: 14 % (ref 10.4–31.8)
TIBC: 393 ug/dL (ref 250–450)
UIBC: 337 ug/dL (ref 148–442)

## 2022-03-23 LAB — FERRITIN: Ferritin: 6 ng/mL — ABNORMAL LOW (ref 11–307)

## 2022-03-23 LAB — RETIC PANEL
Immature Retic Fract: 14.1 % (ref 2.3–15.9)
RBC.: 4.69 MIL/uL (ref 3.87–5.11)
Retic Count, Absolute: 61.9 10*3/uL (ref 19.0–186.0)
Retic Ct Pct: 1.3 % (ref 0.4–3.1)
Reticulocyte Hemoglobin: 28.3 pg (ref >27.9)

## 2022-03-23 LAB — VITAMIN B12: Vitamin B-12: 535 pg/mL (ref 180–914)

## 2022-03-23 LAB — FOLATE: Folate: 6.2 ng/mL (ref >5.9)

## 2022-03-23 LAB — SAVE SMEAR(SSMR), FOR PROVIDER SLIDE REVIEW

## 2022-03-23 LAB — IMMATURE PLATELET FRACTION: Immature Platelet Fraction: 15.2 % — ABNORMAL HIGH (ref 1.2–8.6)

## 2022-03-23 NOTE — Progress Notes (Signed)
[facility] [phone]   [phone]  INITIAL CONSULT NOTE  Patient Care Team: Pcp, No as PCP - General  Hematological/Oncological History # Thrombocytopenia in Setting of Chronic Hepatitis B Infection 01/21/2022: WBC 3.9, Hgb 11.6, MCV 81.9, Plt 94 03/23/2022: establish care with Dr. Leonides Schanz   CHIEF COMPLAINTS/PURPOSE OF CONSULTATION:  "Thrombocytopenia "  HISTORY OF PRESENTING ILLNESS:  Christine Mccarty 46 y.o. female with medical history significant for chronic hepatitis B, latent tuberculosis, and gallstones who presents for evaluation of thrombocytopenia.  On review of the previous records Christine Mccarty has a longstanding history of thrombocytopenia dating back to at least 06/02/2016.  At that time she had platelets of 108.  She has been stably in this range since that time.  When last checked on 01/21/2022 patient was noted to have white blood cell count 3.9, hemoglobin 11.6, MCV 81.9, and platelets of 94.  Due to concern for her chronic thrombocytopenia she was referred to hematology for further evaluation and management.  On exam today Christine Mccarty notes that she has had this for at least 10 years.  She reports that she was told when she had her last child and that was approximately 10 years ago.  She did undergo a bone marrow biopsy before in the past that "did not find anything".  She notes that she has been diagnosed with hepatitis B for approximately 20 years.  She is currently on tenofovir therapy.  She reports that her mother had hepatitis B and she thinks she may have contracted it from her mother.  She reports that she does have difficulty with bleeding with cuts and scratches and she has applied quite a bit of pressure for it to stop.  She notes that with her menstrual cycles they last approximately 5 days and she wears overnight pads.  She goes through 1 every 3 hours and they are completely soaked.  She is currently taking iron pills and does eat  red meat approximately once per week.  She notes that she is not having any spontaneous bleeding such as nosebleeds, gum bleeding, or dark stools.  On further discussion she notes that her mother has a history hepatitis B, hypertension, and prediabetes.  She reports her father passed away from a car accident.  She has a brother who is healthy and 4 children with ages ranging from 32 to 20 years.  She notes that she is a never smoker and does drink alcohol occasionally.  She has a glass of wine approximately every 2 weeks.  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  She notes that she is not having any issues with fevers, chills, sweats, vomiting, nausea, or diarrhea.  A full 10 point ROS was otherwise negative.  MEDICAL HISTORY:  Past Medical History:  Diagnosis Date   Anemia    Arthritis    Family history of adverse reaction to anesthesia    Pt Cousin was confused and a little agitated   Gallstones    Heart murmur    mild heart murmur in past   Hepatitis    Hep B   TB lung, latent     SURGICAL HISTORY: Past Surgical History:  Procedure Laterality Date   CHOLECYSTECTOMY N/A 06/02/2016   Procedure: LAPAROSCOPIC CHOLECYSTECTOMY;  Surgeon: Axel Filler, MD;  Location: [facility] OR;  Service: General;  Laterality: N/A;   TUBAL LIGATION      SOCIAL HISTORY: Social History   Socioeconomic History   Marital status: Unknown  Spouse name: Not on file   Number of children: Not on file   Years of education: Not on file   Highest education level: Not on file  Occupational History   Not on file  Tobacco Use   Smoking status: Never   Smokeless tobacco: Never  Substance and Sexual Activity   Alcohol use: Yes    Alcohol/week: 1.0 standard drink of alcohol    Types: 1 Standard drinks or equivalent per week    Comment: rare social drinker   Drug use: No   Sexual activity: Yes    Partners: Male    Birth control/protection: Surgical  Other Topics Concern   Not on file   Social History Narrative   Not on file   Social Determinants of Health   Financial Resource Strain: Not on file  Food Insecurity: Not on file  Transportation Needs: Not on file  Physical Activity: Not on file  Stress: Not on file  Social Connections: Not on file  Intimate Partner Violence: Not on file    FAMILY HISTORY: Family History  Problem Relation Age of Onset   Hypertension Mother    Arthritis Mother    Hepatitis B Mother     ALLERGIES:  is allergic to prednisone.  MEDICATIONS:  Current Outpatient Medications  Medication Sig Dispense Refill   Vitamin D, Ergocalciferol, (DRISDOL) 1.25 MG (50000 UNIT) CAPS capsule Take 50,000 Units by mouth once a week.     ferrous sulfate 325 (65 FE) MG EC tablet Take 325 mg by mouth 3 (three) times daily with meals.     ibuprofen (ADVIL,MOTRIN) 400 MG tablet Take 400 mg by mouth.     phentermine (ADIPEX-P) 37.5 MG tablet Take 37.5 mg by mouth every morning.     Tenofovir Alafenamide Fumarate (VEMLIDY) 25 MG TABS Take 1 tablet (25 mg total) by mouth daily. 90 tablet 3   No current facility-administered medications for this visit.    REVIEW OF SYSTEMS:   Constitutional: ( - ) fevers, ( - )  chills , ( - ) night sweats Eyes: ( - ) blurriness of vision, ( - ) double vision, ( - ) watery eyes Ears, nose, mouth, throat, and face: ( - ) mucositis, ( - ) sore throat Respiratory: ( - ) cough, ( - ) dyspnea, ( - ) wheezes Cardiovascular: ( - ) palpitation, ( - ) chest discomfort, ( - ) lower extremity swelling Gastrointestinal:  ( - ) nausea, ( - ) heartburn, ( - ) change in bowel habits Skin: ( - ) abnormal skin rashes Lymphatics: ( - ) new lymphadenopathy, ( - ) easy bruising Neurological: ( - ) numbness, ( - ) tingling, ( - ) new weaknesses Behavioral/Psych: ( - ) mood change, ( - ) new changes  All other systems were reviewed with the patient and are negative.  PHYSICAL EXAMINATION:  Vitals:   03/23/22 0910  BP: 118/61  Pulse:  74  Resp: 14  Temp: 98.3 F (36.8 C)  SpO2: 100%   Filed Weights   03/23/22 0910  Weight: 205 lb (93 kg)    GENERAL: well appearing middle-aged African-American female in NAD  SKIN: skin color, texture, turgor are normal, no rashes or significant lesions EYES: conjunctiva are pink and non-injected, sclera clear LUNGS: clear to auscultation and percussion with normal breathing effort HEART: regular rate & rhythm and no murmurs and no lower extremity edema Musculoskeletal: no cyanosis of digits and no clubbing  PSYCH: alert & oriented x 3,  fluent speech NEURO: no focal motor/sensory deficits  LABORATORY DATA:  I have reviewed the data as listed    Latest Ref Rng & Units 03/23/2022   10:04 AM 11/20/2018    8:49 AM 05/17/2018    9:00 AM  CBC  WBC 4.0 - 10.5 K/uL 4.3  3.8  4.4   Hemoglobin 12.0 - 15.0 g/dL 12.5  12.0  11.5   Hematocrit 36.0 - 46.0 % 38.3  37.0  34.6   Platelets 150 - 400 K/uL 110  113  107        Latest Ref Rng & Units 03/23/2022   10:04 AM 10/08/2021    9:51 AM 09/04/2020    9:53 AM  CMP  Glucose 70 - 99 mg/dL 95  94  84   BUN 6 - 20 mg/dL $Remove'13  19  15   'lSLWstQ$ Creatinine 0.44 - 1.00 mg/dL 0.94  0.94  1.00   Sodium 135 - 145 mmol/L 138  139  138   Potassium 3.5 - 5.1 mmol/L 4.4  4.8  4.5   Chloride 98 - 111 mmol/L 106  108  104   CO2 22 - 32 mmol/L $RemoveB'29  26  24   'tulyFJLU$ Calcium 8.9 - 10.3 mg/dL 9.1  9.5  9.2   Total Protein 6.5 - 8.1 g/dL 7.4  7.3  7.0   Total Bilirubin 0.3 - 1.2 mg/dL 0.3  0.4  0.5   Alkaline Phos 38 - 126 U/L 104     AST 15 - 41 U/L $Remo'15  15  15   'KEBgq$ ALT 0 - 44 U/L $Remo'7  9  8      'BSwny$ ASSESSMENT & PLAN Christine Mccarty 46 y.o. female with medical history significant for chronic hepatitis B, latent tuberculosis, and gallstones who presents for evaluation of thrombocytopenia.  After review of the labs, review of the records, and discussion with the patient the patients findings are most consistent with thrombocytopenia secondary to chronic hep B  infection.  Thrombocytopenia is a common condition with a broad differential. The possible etiologies of thrombocytopenia include liver disease, splenomegaly, infectious process, nutritional deficiency, consumption/autoimmune destruction, pseudothrombocytopenia, and bone marrow disorders. Cirrhosis and liver disease the most common causes of moderate thrombocytopenia. Evaluation should include full hepatitis serologies (Hep B and C) as well as HIV. Imaging of the liver spleen should be performed with an abdominal US ( if prior imaging is no readily available). Nutritional etiologies should be ruled out with Vitamin b12 and folate testing.  A peripheral blood smear can help determine if there is clumping leading to pseudothrombocytopenia. If no clear etiology can be found would need to consider immune thrombocytopenia (ITP) with consideration of bone marrow biopsy.   #Thrombocytopenia, mild # Chronic Hepatitis B  --will order CBC, CMP, Vitamin b12 and folate.   --will review peripheral blood film to r/o clumping  -- Known Hepatitis B infection, prior viral serology work-up. --evaluate for liver disease/splenomegaly with abdominal US. If evidence of liver disease will make referral to gastroenterology.  -- At this time strong suspicion that her thrombocytopenia is secondary to her chronic hepatitis B.  We will rule out other possibilities with the above work-up. --If not related to chronic hepatitis B this may be a mild ITP.  In that case her platelets are well above the treatment range. --RTC pending the results of the above studies  Orders Placed This Encounter  Procedures   US Abdomen Complete    Standing Status:   Future  Standing Expiration Date:   03/23/2023    Order Specific Question:   Reason for Exam (SYMPTOM  OR DIAGNOSIS REQUIRED)    Answer:   known Hep B, assess for liver disease/splenomegaly.    Order Specific Question:   Preferred imaging location?    Answer:   [facility]   CBC with Differential (Cancer Center Only)    Standing Status:   Future    Number of Occurrences:   1    Standing Expiration Date:   03/24/2023   CMP ([place] only)    Standing Status:   Future    Number of Occurrences:   1    Standing Expiration Date:   03/24/2023   Iron and Iron Binding Capacity ([facility] only)    Standing Status:   Future    Number of Occurrences:   1    Standing Expiration Date:   03/24/2023   Ferritin    Standing Status:   Future    Number of Occurrences:   1    Standing Expiration Date:   03/24/2023   Retic Panel    Standing Status:   Future    Number of Occurrences:   1    Standing Expiration Date:   03/24/2023   Vitamin B12    Standing Status:   Future    Number of Occurrences:   1    Standing Expiration Date:   03/23/2023   Folate, Serum    Standing Status:   Future    Number of Occurrences:   1    Standing Expiration Date:   03/23/2023   Immature Platelet Fraction    Standing Status:   Future    Number of Occurrences:   1    Standing Expiration Date:   03/24/2023   Save Smear for Provider Slide Review    Standing Status:   Future    Number of Occurrences:   1    Standing Expiration Date:   03/24/2023    All questions were answered. The patient knows to call the clinic with any problems, questions or concerns.  A total of more than 60 minutes were spent on this encounter with face-to-face time and non-face-to-face time, including preparing to see the patient, ordering tests and/or medications, counseling the patient and coordination of care as outlined above.   Ledell Peoples, MD Department of Hematology/Oncology [place] Phone: [phone] Pager: [phone] Email: [email or web address]  03/26/2022 4:47 PM  Literature Support:  Tacey Ruiz Hepatitis B infection is associated with an increased incidence of thrombocytopenia in healthy adults  without cirrhosis. J Viral Hepat. 2017 Mar;24(3):253-258  -- HBsAg positivity was strongly and independently associated with incident thrombocytopenia, indicating that mechanisms of thrombocytopenia other than portal hypertension may exist in healthy HBV carriers.

## 2022-03-30 ENCOUNTER — Other Ambulatory Visit (HOSPITAL_COMMUNITY): Payer: Self-pay

## 2022-04-04 ENCOUNTER — Other Ambulatory Visit (HOSPITAL_COMMUNITY): Payer: Self-pay

## 2022-04-06 ENCOUNTER — Other Ambulatory Visit (HOSPITAL_COMMUNITY): Payer: Self-pay

## 2022-04-08 ENCOUNTER — Ambulatory Visit (HOSPITAL_COMMUNITY)
Admission: RE | Admit: 2022-04-08 | Discharge: 2022-04-08 | Disposition: A | Discharge status: Home / Self Care | Payer: 59 | Source: Ambulatory Visit | Attending: Hematology and Oncology | Admitting: Hematology and Oncology

## 2022-04-08 DIAGNOSIS — D696 Thrombocytopenia, unspecified: Secondary | ICD-10-CM | POA: Diagnosis not present

## 2022-04-08 DIAGNOSIS — K760 Fatty (change of) liver, not elsewhere classified: Secondary | ICD-10-CM | POA: Diagnosis not present

## 2022-04-12 ENCOUNTER — Other Ambulatory Visit (HOSPITAL_COMMUNITY): Payer: Self-pay

## 2022-04-19 ENCOUNTER — Other Ambulatory Visit: Payer: 59

## 2022-04-28 ENCOUNTER — Other Ambulatory Visit (HOSPITAL_COMMUNITY): Payer: Self-pay

## 2022-05-02 ENCOUNTER — Other Ambulatory Visit: Payer: Self-pay | Admitting: Internal Medicine

## 2022-05-02 ENCOUNTER — Other Ambulatory Visit (HOSPITAL_COMMUNITY): Payer: Self-pay

## 2022-05-02 DIAGNOSIS — B181 Chronic viral hepatitis B without delta-agent: Secondary | ICD-10-CM

## 2022-05-03 ENCOUNTER — Telehealth: Payer: Self-pay

## 2022-05-03 ENCOUNTER — Ambulatory Visit: Payer: 59 | Admitting: Internal Medicine

## 2022-05-03 NOTE — Telephone Encounter (Signed)
Called patient to see if she would be able to make it to today's appointment, no answer. Left HIPAA compliant voicemail requesting callback.   Jakie Debow D Sahej Hauswirth, RN  

## 2022-05-03 NOTE — Telephone Encounter (Signed)
Pt has follow up 11/30. Refills pending. Pt confirms she has medication on hand.

## 2022-05-04 ENCOUNTER — Other Ambulatory Visit (HOSPITAL_COMMUNITY): Payer: Self-pay

## 2022-05-04 MED ORDER — VEMLIDY 25 MG PO TABS
25.0000 mg | ORAL_TABLET | Freq: Every day | ORAL | 0 refills | Status: DC
  Filled 2022-05-04: qty 30, 30d supply, fill #0

## 2022-05-09 ENCOUNTER — Telehealth: Payer: Self-pay | Admitting: *Deleted

## 2022-05-09 MED ORDER — FERROUS SULFATE 325 (65 FE) MG PO TABS: 325.0000 mg | ORAL_TABLET | Freq: Every day | ORAL | 3 refills | Status: AC

## 2022-05-09 NOTE — Telephone Encounter (Signed)
TCT patient regarding her recent lab results. No answer. Left VM for pt to return this call at her convenience @ [phone]

## 2022-05-09 NOTE — Telephone Encounter (Signed)
-----   Message from Jaci Standard, MD sent at 05/08/2022  6:21 PM EST ----- Please let Christine Mccarty know that the ultrasound of her liver did show some fatty depositions in her liver but no evidence of cirrhosis.  Additionally it does appear that she is iron deficient.  I would recommend that she take p.o. ferrous sulfate 325 mg daily with a source of vitamin C.  If she is not able to tolerate this please have her let us know.  Also encouraged her to eat an iron rich diet.  I have also made a referral to gastroenterology for evaluation of her fatty liver.  There is no need for routine follow-up in our clinic.  ----- Message ----- From: Interface, Lab In [place] Sent: 03/23/2022  10:14 AM EST To: Jaci Standard, MD

## 2022-05-11 ENCOUNTER — Telehealth: Payer: Self-pay | Admitting: Internal Medicine

## 2022-05-11 NOTE — Telephone Encounter (Signed)
Called patient to schedule an office visit with Dr. Leonides Schanz for fatty liver evaluation. Left voicemail.

## 2022-05-12 ENCOUNTER — Other Ambulatory Visit: Payer: Self-pay

## 2022-05-12 ENCOUNTER — Encounter: Payer: Self-pay | Admitting: Internal Medicine

## 2022-05-12 ENCOUNTER — Other Ambulatory Visit (HOSPITAL_COMMUNITY): Payer: Self-pay

## 2022-05-12 ENCOUNTER — Ambulatory Visit (INDEPENDENT_AMBULATORY_CARE_PROVIDER_SITE_OTHER): Payer: 59 | Admitting: Internal Medicine

## 2022-05-12 DIAGNOSIS — B181 Chronic viral hepatitis B without delta-agent: Secondary | ICD-10-CM | POA: Diagnosis not present

## 2022-05-12 MED ORDER — VEMLIDY 25 MG PO TABS
25.0000 mg | ORAL_TABLET | Freq: Every day | ORAL | 11 refills | Status: DC
  Filled 2022-05-12 – 2022-06-03 (×2): qty 30, 30d supply, fill #0
  Filled 2022-07-08: qty 30, 30d supply, fill #1
  Filled 2022-08-01: qty 30, 30d supply, fill #2
  Filled 2022-09-02: qty 30, 30d supply, fill #3

## 2022-05-12 NOTE — Progress Notes (Signed)
Virtual Visit via Video Note  I connected with Christine Mccarty on 05/12/22 at  9:45 AM EST by a video enabled telemedicine application and verified that I am speaking with the correct person using two identifiers.  Location: Patient: Home  Provider: [facility]   I discussed the limitations of evaluation and management by telemedicine and the availability of in person appointments. The patient expressed understanding and agreed to proceed.  History of Present Illness: I called and spoke with Debbie today.  She denies any problems obtaining, taking or tolerating Vemlidy.  It is mailed to her each month from [facility] outpatient pharmacy.  She takes it each evening and has not missed any doses.  She is feeling well.   Observations/Objective: Lab work 10/08/2021 Liver enzymes normal Hepatitis B e antigen negative; hepatitis B e antibody positive Hepatitis B DNA viral load less than 10 Platelets 03/23/2022 110,000 FibroTest 2017 F0   Assessment and Plan: Her chronic active hepatitis B has been under excellent control ever since starting therapy years ago.  She will continue Vemlidy and follow-up after lab work in 6 months  Follow Up Instructions: Continue Vemlidy Follow-up after lab work in April 2024   I discussed the assessment and treatment plan with the patient. The patient was provided an opportunity to ask questions and all were answered. The patient agreed with the plan and demonstrated an understanding of the instructions.   The patient was advised to call back or seek an in-person evaluation if the symptoms worsen or if the condition fails to improve as anticipated.  I provided 16 minutes of non-face-to-face time during this encounter.   Cliffton Asters, MD

## 2022-05-18 ENCOUNTER — Other Ambulatory Visit (HOSPITAL_COMMUNITY): Payer: Self-pay

## 2022-05-18 DIAGNOSIS — K644 Residual hemorrhoidal skin tags: Secondary | ICD-10-CM | POA: Diagnosis not present

## 2022-05-18 DIAGNOSIS — F5102 Adjustment insomnia: Secondary | ICD-10-CM | POA: Diagnosis not present

## 2022-05-18 DIAGNOSIS — E669 Obesity, unspecified: Secondary | ICD-10-CM | POA: Diagnosis not present

## 2022-05-18 MED ORDER — DOXYCYCLINE HYCLATE 100 MG PO TABS
100.0000 mg | ORAL_TABLET | Freq: Two times a day (BID) | ORAL | 0 refills | Status: AC
  Filled 2022-05-18: qty 14, 7d supply, fill #0

## 2022-05-18 MED ORDER — PHENTERMINE HCL 37.5 MG PO TABS
37.5000 mg | ORAL_TABLET | Freq: Every day | ORAL | 2 refills | Status: DC
  Filled 2022-05-18 – 2022-09-07 (×2): qty 30, 30d supply, fill #0
  Filled 2022-10-26 (×2): qty 30, 30d supply, fill #1

## 2022-05-18 MED ORDER — TOPIRAMATE 25 MG PO TABS
25.0000 mg | ORAL_TABLET | Freq: Two times a day (BID) | ORAL | 2 refills | Status: DC
  Filled 2022-05-18: qty 60, 30d supply, fill #0
  Filled 2022-07-08 (×3): qty 60, 30d supply, fill #1
  Filled 2022-09-07: qty 60, 30d supply, fill #2

## 2022-05-18 MED ORDER — TRAZODONE HCL 50 MG PO TABS
50.0000 mg | ORAL_TABLET | Freq: Every evening | ORAL | 1 refills | Status: AC
  Filled 2022-05-18: qty 90, 90d supply, fill #0

## 2022-05-18 MED ORDER — HYDROCORTISONE (PERIANAL) 2.5 % EX CREA
TOPICAL_CREAM | CUTANEOUS | 0 refills | Status: AC
  Filled 2022-05-18: qty 30, 7d supply, fill #0

## 2022-05-23 ENCOUNTER — Other Ambulatory Visit (HOSPITAL_COMMUNITY): Payer: Self-pay

## 2022-05-25 ENCOUNTER — Other Ambulatory Visit: Payer: Self-pay

## 2022-05-27 ENCOUNTER — Other Ambulatory Visit (HOSPITAL_COMMUNITY): Payer: Self-pay

## 2022-05-30 ENCOUNTER — Other Ambulatory Visit: Payer: Self-pay

## 2022-05-30 ENCOUNTER — Other Ambulatory Visit (HOSPITAL_COMMUNITY): Payer: Self-pay

## 2022-05-30 MED ORDER — NA SULFATE-K SULFATE-MG SULF 17.5-3.13-1.6 GM/177ML PO SOLN
ORAL | 0 refills | Status: AC
  Filled 2022-05-30: qty 354, 1d supply, fill #0

## 2022-05-30 MED ORDER — MIRTAZAPINE 15 MG PO TABS
15.0000 mg | ORAL_TABLET | Freq: Every day | ORAL | 2 refills | Status: DC
  Filled 2022-05-30: qty 30, 30d supply, fill #0

## 2022-06-03 ENCOUNTER — Other Ambulatory Visit (HOSPITAL_COMMUNITY): Payer: Self-pay

## 2022-06-08 ENCOUNTER — Other Ambulatory Visit (HOSPITAL_COMMUNITY): Payer: Self-pay

## 2022-06-08 MED ORDER — WEGOVY 0.5 MG/0.5ML ~~LOC~~ SOAJ
0.5000 mg | SUBCUTANEOUS | 0 refills | Status: AC
  Filled 2022-06-08: qty 2, 28d supply, fill #0

## 2022-06-08 MED ORDER — HYDROXYZINE HCL 25 MG PO TABS
25.0000 mg | ORAL_TABLET | Freq: Every evening | ORAL | 2 refills | Status: DC | PRN
  Filled 2022-06-08: qty 60, 30d supply, fill #0
  Filled 2022-09-07: qty 60, 30d supply, fill #1
  Filled 2022-10-26 (×2): qty 60, 30d supply, fill #2

## 2022-06-09 DIAGNOSIS — K5730 Diverticulosis of large intestine without perforation or abscess without bleeding: Secondary | ICD-10-CM | POA: Diagnosis not present

## 2022-06-09 DIAGNOSIS — Z1211 Encounter for screening for malignant neoplasm of colon: Secondary | ICD-10-CM | POA: Diagnosis not present

## 2022-06-21 ENCOUNTER — Other Ambulatory Visit (HOSPITAL_COMMUNITY): Payer: Self-pay

## 2022-06-23 ENCOUNTER — Other Ambulatory Visit (HOSPITAL_COMMUNITY): Payer: Self-pay

## 2022-06-28 ENCOUNTER — Other Ambulatory Visit (HOSPITAL_COMMUNITY): Payer: Self-pay

## 2022-07-08 ENCOUNTER — Other Ambulatory Visit (HOSPITAL_COMMUNITY): Payer: Self-pay

## 2022-07-08 ENCOUNTER — Other Ambulatory Visit: Payer: Self-pay

## 2022-07-26 ENCOUNTER — Other Ambulatory Visit (HOSPITAL_COMMUNITY): Payer: Self-pay

## 2022-08-01 ENCOUNTER — Other Ambulatory Visit: Payer: Self-pay

## 2022-08-02 ENCOUNTER — Other Ambulatory Visit (HOSPITAL_COMMUNITY): Payer: Self-pay

## 2022-08-25 ENCOUNTER — Other Ambulatory Visit (HOSPITAL_COMMUNITY): Payer: Self-pay

## 2022-08-29 ENCOUNTER — Other Ambulatory Visit (HOSPITAL_COMMUNITY): Payer: Self-pay

## 2022-08-31 ENCOUNTER — Other Ambulatory Visit (HOSPITAL_COMMUNITY): Payer: Self-pay

## 2022-09-02 ENCOUNTER — Other Ambulatory Visit (HOSPITAL_COMMUNITY): Payer: Self-pay

## 2022-09-06 ENCOUNTER — Other Ambulatory Visit (HOSPITAL_COMMUNITY): Payer: Self-pay

## 2022-09-07 ENCOUNTER — Other Ambulatory Visit: Payer: Self-pay

## 2022-09-07 ENCOUNTER — Other Ambulatory Visit (HOSPITAL_COMMUNITY): Payer: Self-pay

## 2022-09-12 ENCOUNTER — Telehealth: Payer: Self-pay | Admitting: Internal Medicine

## 2022-09-12 NOTE — Telephone Encounter (Signed)
Christine Mccarty called to reschedule her appointments due to her work schedule. She has requested the office visit be switched to telephone. Please let me know a decision and I will update the patient.

## 2022-09-13 ENCOUNTER — Other Ambulatory Visit: Payer: Self-pay

## 2022-09-15 ENCOUNTER — Other Ambulatory Visit: Payer: Self-pay

## 2022-09-16 ENCOUNTER — Other Ambulatory Visit: Payer: Commercial Managed Care - PPO

## 2022-09-16 ENCOUNTER — Other Ambulatory Visit: Payer: Self-pay

## 2022-09-16 DIAGNOSIS — B181 Chronic viral hepatitis B without delta-agent: Secondary | ICD-10-CM | POA: Diagnosis not present

## 2022-09-23 LAB — CBC
HCT: 35.9 % (ref 35.0–45.0)
Hemoglobin: 11.5 g/dL — ABNORMAL LOW (ref 11.7–15.5)
MCH: 26.9 pg — ABNORMAL LOW (ref 27.0–33.0)
MCHC: 32 g/dL (ref 32.0–36.0)
MCV: 84.1 fL (ref 80.0–100.0)
MPV: 12.6 fL — ABNORMAL HIGH (ref 7.5–12.5)
Platelets: 144 10*3/uL (ref 140–400)
RBC: 4.27 10*6/uL (ref 3.80–5.10)
RDW: 13.7 % (ref 11.0–15.0)
WBC: 4 10*3/uL (ref 3.8–10.8)

## 2022-09-23 LAB — HEPATITIS B DNA, ULTRAQUANTITATIVE, PCR
Hepatitis B DNA: NOT DETECTED IU/mL
Hepatitis B virus DNA: NOT DETECTED Log IU/mL

## 2022-09-23 LAB — HEPATITIS B E ANTIGEN: Hep B E Ag: NONREACTIVE

## 2022-09-23 LAB — COMPREHENSIVE METABOLIC PANEL
AG Ratio: 1.6 (calc) (ref 1.0–2.5)
ALT: 8 U/L (ref 6–29)
AST: 14 U/L (ref 10–35)
Albumin: 4.2 g/dL (ref 3.6–5.1)
Alkaline phosphatase (APISO): 110 U/L (ref 31–125)
BUN: 15 mg/dL (ref 7–25)
CO2: 24 mmol/L (ref 20–32)
Calcium: 9 mg/dL (ref 8.6–10.2)
Chloride: 110 mmol/L (ref 98–110)
Creat: 0.83 mg/dL (ref 0.50–0.99)
Globulin: 2.6 g/dL (calc) (ref 1.9–3.7)
Glucose, Bld: 85 mg/dL (ref 65–99)
Potassium: 4.2 mmol/L (ref 3.5–5.3)
Sodium: 141 mmol/L (ref 135–146)
Total Bilirubin: 0.3 mg/dL (ref 0.2–1.2)
Total Protein: 6.8 g/dL (ref 6.1–8.1)

## 2022-09-23 LAB — LIVER FIBROSIS, FIBROTEST-ACTITEST
ALT: 7 U/L (ref 6–29)
Alpha-2-Macroglobulin: 243 mg/dL (ref 106–279)
Apolipoprotein A1: 200 mg/dL — ABNORMAL HIGH (ref 101–198)
Bilirubin: 0.3 mg/dL (ref 0.2–1.2)
Fibrosis Score: 0.07
GGT: 17 U/L (ref 3–55)
Haptoglobin: 143 mg/dL (ref 43–212)
Necroinflammat ACT Score: 0.01
Reference ID: 4864104

## 2022-09-23 LAB — HEPATITIS B E ANTIBODY: Hep B E Ab: REACTIVE — AB

## 2022-09-27 ENCOUNTER — Ambulatory Visit: Payer: Self-pay | Admitting: Internal Medicine

## 2022-09-29 ENCOUNTER — Other Ambulatory Visit: Payer: Self-pay

## 2022-09-29 ENCOUNTER — Other Ambulatory Visit (HOSPITAL_COMMUNITY): Payer: Self-pay

## 2022-09-29 ENCOUNTER — Telehealth: Payer: Commercial Managed Care - PPO | Admitting: Internal Medicine

## 2022-09-29 DIAGNOSIS — B181 Chronic viral hepatitis B without delta-agent: Secondary | ICD-10-CM

## 2022-09-29 MED ORDER — VEMLIDY 25 MG PO TABS
25.0000 mg | ORAL_TABLET | Freq: Every day | ORAL | 11 refills | Status: DC
Start: 2022-09-29 — End: 2023-11-02
  Filled 2022-09-29 – 2022-10-07 (×2): qty 30, 30d supply, fill #0
  Filled 2022-11-03: qty 30, 30d supply, fill #1
  Filled 2022-12-05: qty 30, 30d supply, fill #2
  Filled 2023-01-02: qty 30, 30d supply, fill #3
  Filled 2023-02-06 (×2): qty 30, 30d supply, fill #4
  Filled 2023-03-23: qty 30, 30d supply, fill #5
  Filled 2023-05-15: qty 30, 30d supply, fill #6
  Filled 2023-07-04: qty 30, 30d supply, fill #7
  Filled 2023-08-29 (×2): qty 30, 30d supply, fill #8
  Filled 2023-09-28: qty 30, 30d supply, fill #9

## 2022-09-29 NOTE — Progress Notes (Signed)
Virtual Visit via Video Note  I connected with Christine Mccarty on 09/29/22 at  9:15 AM EDT by a video enabled telemedicine application and verified that I am speaking with the correct person using two identifiers.  Location: Patient: Home Provider: [facility]   I discussed the limitations of evaluation and management by telemedicine and the availability of in person appointments. The patient expressed understanding and agreed to proceed.  History of Present Illness: I conducted a video visit with Christine Mccarty today. She is a 47 y.o. female  who is originally from [place]. While living in [place] about 2009 she developed thrombocytopenia and elevated liver enzymes and was found to have chronic hepatitis B. She was started and believes her infection was been under good control up until the time she moved here in 2017.  Her liver enzymes at that time were normal.  Her hepatitis B viral load was undetectable.  She was hepatitis B surface antigen positive, hepatitis B e antigen negative and her fibrosis score was F0.  She continued evaluated initially until I switched her to [facility] and 2018.  She is taking it faithfully since that time.  Liver enzymes have remained normal and her viral load has consistently been undetectable.  She remains E antigen negative   Observations/Objective: Labs 09/16/2022 LFTs normal Hepatitis B DNA viral load undetectable Hepatitis B e antigen negative Hepatitis B E antibody positive Fibrosis score F0   Assessment and Plan: Her hepatitis B remains under excellent long-term control without signs of progressive liver disease.  Follow Up Instructions: She will continue Vemlidy and follow-up after lab work in 6 months   I discussed the assessment and treatment plan with the patient. The patient was provided an opportunity to ask questions and all were answered. The patient agreed with the plan and demonstrated an understanding of the instructions.   The patient was advised to call  back or seek an in-person evaluation if the symptoms worsen or if the condition fails to improve as anticipated.  I provided 18 minutes of non-face-to-face time during this encounter.   Cliffton Asters, MD

## 2022-10-04 ENCOUNTER — Other Ambulatory Visit (HOSPITAL_COMMUNITY): Payer: Self-pay

## 2022-10-07 ENCOUNTER — Other Ambulatory Visit (HOSPITAL_COMMUNITY): Payer: Self-pay

## 2022-10-07 ENCOUNTER — Other Ambulatory Visit: Payer: Self-pay

## 2022-10-26 ENCOUNTER — Other Ambulatory Visit (HOSPITAL_COMMUNITY): Payer: Self-pay

## 2022-11-03 ENCOUNTER — Other Ambulatory Visit (HOSPITAL_COMMUNITY): Payer: Self-pay

## 2022-11-30 ENCOUNTER — Other Ambulatory Visit (HOSPITAL_COMMUNITY): Payer: Self-pay

## 2022-12-02 ENCOUNTER — Other Ambulatory Visit (HOSPITAL_COMMUNITY): Payer: Self-pay

## 2022-12-02 ENCOUNTER — Telehealth: Payer: Self-pay | Admitting: Hematology and Oncology

## 2022-12-05 ENCOUNTER — Other Ambulatory Visit (HOSPITAL_COMMUNITY): Payer: Self-pay

## 2022-12-07 ENCOUNTER — Other Ambulatory Visit: Payer: Self-pay

## 2022-12-07 ENCOUNTER — Other Ambulatory Visit (HOSPITAL_COMMUNITY): Payer: Self-pay

## 2022-12-07 DIAGNOSIS — R5383 Other fatigue: Secondary | ICD-10-CM | POA: Diagnosis not present

## 2022-12-07 DIAGNOSIS — Z6832 Body mass index (BMI) 32.0-32.9, adult: Secondary | ICD-10-CM | POA: Diagnosis not present

## 2022-12-07 DIAGNOSIS — M791 Myalgia, unspecified site: Secondary | ICD-10-CM | POA: Diagnosis not present

## 2022-12-07 MED ORDER — PHENTERMINE HCL 37.5 MG PO TABS
37.5000 mg | ORAL_TABLET | Freq: Every day | ORAL | 2 refills | Status: DC
  Filled 2022-12-07: qty 30, 30d supply, fill #0
  Filled 2023-02-06 (×2): qty 30, 30d supply, fill #1
  Filled 2023-03-23: qty 30, 30d supply, fill #2

## 2022-12-07 MED ORDER — TOPIRAMATE 25 MG PO TABS
25.0000 mg | ORAL_TABLET | Freq: Two times a day (BID) | ORAL | 2 refills | Status: DC
  Filled 2022-12-07: qty 60, 30d supply, fill #0
  Filled 2023-03-23: qty 60, 30d supply, fill #1

## 2022-12-08 ENCOUNTER — Other Ambulatory Visit (HOSPITAL_COMMUNITY): Payer: Self-pay

## 2022-12-08 MED ORDER — VITAMIN D (ERGOCALCIFEROL) 1.25 MG (50000 UNIT) PO CAPS
50000.0000 [IU] | ORAL_CAPSULE | ORAL | 0 refills | Status: DC
  Filled 2022-12-08: qty 12, 84d supply, fill #0

## 2022-12-22 ENCOUNTER — Other Ambulatory Visit: Payer: Self-pay | Admitting: Hematology and Oncology

## 2022-12-22 ENCOUNTER — Inpatient Hospital Stay: Payer: Commercial Managed Care - PPO | Attending: Hematology and Oncology

## 2022-12-22 ENCOUNTER — Other Ambulatory Visit: Payer: Self-pay

## 2022-12-22 ENCOUNTER — Inpatient Hospital Stay (HOSPITAL_BASED_OUTPATIENT_CLINIC_OR_DEPARTMENT_OTHER): Payer: Commercial Managed Care - PPO | Admitting: Hematology and Oncology

## 2022-12-22 VITALS — BP 138/85 | HR 71 | Temp 98.0°F | Resp 14 | Wt 199.6 lb

## 2022-12-22 DIAGNOSIS — D696 Thrombocytopenia, unspecified: Secondary | ICD-10-CM

## 2022-12-22 DIAGNOSIS — N920 Excessive and frequent menstruation with regular cycle: Secondary | ICD-10-CM | POA: Insufficient documentation

## 2022-12-22 DIAGNOSIS — D500 Iron deficiency anemia secondary to blood loss (chronic): Secondary | ICD-10-CM | POA: Insufficient documentation

## 2022-12-22 DIAGNOSIS — D6959 Other secondary thrombocytopenia: Secondary | ICD-10-CM | POA: Diagnosis not present

## 2022-12-22 DIAGNOSIS — B181 Chronic viral hepatitis B without delta-agent: Secondary | ICD-10-CM | POA: Diagnosis not present

## 2022-12-22 LAB — CBC WITH DIFFERENTIAL (CANCER CENTER ONLY)
Abs Immature Granulocytes: 0.01 10*3/uL (ref 0.00–0.07)
Basophils Absolute: 0.1 10*3/uL (ref 0.0–0.1)
Basophils Relative: 1 %
Eosinophils Absolute: 0 10*3/uL (ref 0.0–0.5)
Eosinophils Relative: 1 %
HCT: 37.3 % (ref 36.0–46.0)
Hemoglobin: 11.8 g/dL — ABNORMAL LOW (ref 12.0–15.0)
Immature Granulocytes: 0 %
Lymphocytes Relative: 36 %
Lymphs Abs: 1.5 10*3/uL (ref 0.7–4.0)
MCH: 27 pg (ref 26.0–34.0)
MCHC: 31.6 g/dL (ref 30.0–36.0)
MCV: 85.4 fL (ref 80.0–100.0)
Monocytes Absolute: 0.3 10*3/uL (ref 0.1–1.0)
Monocytes Relative: 8 %
Neutro Abs: 2.3 10*3/uL (ref 1.7–7.7)
Neutrophils Relative %: 54 %
Platelet Count: 118 10*3/uL — ABNORMAL LOW (ref 150–400)
RBC: 4.37 MIL/uL (ref 3.87–5.11)
RDW: 14.1 % (ref 11.5–15.5)
WBC Count: 4.2 10*3/uL (ref 4.0–10.5)
nRBC: 0 % (ref 0.0–0.2)

## 2022-12-22 LAB — CMP (CANCER CENTER ONLY)
ALT: 10 U/L (ref 0–44)
AST: 17 U/L (ref 15–41)
Albumin: 4.1 g/dL (ref 3.5–5.0)
Alkaline Phosphatase: 105 U/L (ref 38–126)
Anion gap: 5 (ref 5–15)
BUN: 15 mg/dL (ref 6–20)
CO2: 28 mmol/L (ref 22–32)
Calcium: 9.7 mg/dL (ref 8.9–10.3)
Chloride: 107 mmol/L (ref 98–111)
Creatinine: 1 mg/dL (ref 0.44–1.00)
GFR, Estimated: >60 mL/min (ref >60)
Glucose, Bld: 82 mg/dL (ref 70–99)
Potassium: 4.1 mmol/L (ref 3.5–5.1)
Sodium: 140 mmol/L (ref 135–145)
Total Bilirubin: 0.4 mg/dL (ref 0.3–1.2)
Total Protein: 7.2 g/dL (ref 6.5–8.1)

## 2022-12-22 LAB — IMMATURE PLATELET FRACTION: Immature Platelet Fraction: 12 % — ABNORMAL HIGH (ref 1.2–8.6)

## 2022-12-22 NOTE — Progress Notes (Signed)
[facility] [phone]   [phone]  PROGRESS NOTE  Patient Care Team: Bernita Buffy as PCP - General (Physician Assistant)  Hematological/Oncological History # Thrombocytopenia in Setting of Chronic Hepatitis B Infection 01/21/2022: WBC 3.9, Hgb 11.6, MCV 81.9, Plt 94 03/23/2022: establish care with Dr. Leonides Schanz   Interval History:  Christine Mccarty 47 y.o. female with medical history significant for thrombocytopenia in setting of chronic Hep B infection who presents for a follow up visit. The patient's last visit was on 03/23/2022. In the interim since the last visit she has followed with infectious disease.  On exam today Christine Mccarty reports that she has been feeling tired and fatigued.  She reports that throughout the day today she feels like she has no energy and wants to sleep.  She is unsure why.  She notes that she does her best to try to exercise but unfortunately exercise is not causing her the level of weight loss she would like.  She reports that her P CP has ordered paramedics and it has not been helping her.  She reports her target weight is 165 pounds and she is discouraged that she is currently 199 pounds.  She reports that she takes her iron pills, but typically every other day.  She continues to have heavy menstrual bleeding.  She reports that she has not began having any menopausal symptoms.  Overall her health has been steady and she is not having any fevers, chills, sweats, nausea, vomiting or diarrhea.  A full 10 point ROS is otherwise negative.  MEDICAL HISTORY:  Past Medical History:  Diagnosis Date   Anemia    Arthritis    Family history of adverse reaction to anesthesia    Pt Cousin was confused and a little agitated   Gallstones    Heart murmur    mild heart murmur in past   Hepatitis    Hep B   TB lung, latent     SURGICAL HISTORY: Past Surgical History:  Procedure Laterality Date   CHOLECYSTECTOMY N/A  06/02/2016   Procedure: LAPAROSCOPIC CHOLECYSTECTOMY;  Surgeon: Axel Filler, MD;  Location: [facility] OR;  Service: General;  Laterality: N/A;   TUBAL LIGATION      SOCIAL HISTORY: Social History   Socioeconomic History   Marital status: Unknown    Spouse name: Not on file   Number of children: Not on file   Years of education: Not on file   Highest education level: Not on file  Occupational History   Not on file  Tobacco Use   Smoking status: Never   Smokeless tobacco: Never  Substance and Sexual Activity   Alcohol use: Yes    Alcohol/week: 1.0 standard drink of alcohol    Types: 1 Standard drinks or equivalent per week    Comment: rare social drinker   Drug use: No   Sexual activity: Yes    Partners: Male    Birth control/protection: Surgical  Other Topics Concern   Not on file  Social History Narrative   Not on file   Social Determinants of Health   Financial Resource Strain: Not on file  Food Insecurity: Not on file  Transportation Needs: Not on file  Physical Activity: Not on file  Stress: Not on file  Social Connections: Not on file  Intimate Partner Violence: Not on file    FAMILY HISTORY: Family History  Problem Relation Age of Onset   Hypertension Mother    Arthritis Mother  Hepatitis B Mother     ALLERGIES:  is allergic to prednisone.  MEDICATIONS:  Current Outpatient Medications  Medication Sig Dispense Refill   ferrous sulfate (FERROUSUL) 325 (65 FE) MG tablet Take 1 tablet (325 mg total) by mouth daily with breakfast. Take daily with a source of Vitamin C for improved absorption. 30 tablet 3   hydrocortisone (ANUSOL-HC) 2.5 % rectal cream Place rectally 2 times daily. For 1 week 30 g 0   hydrOXYzine (ATARAX) 25 MG tablet Take 1-2 tablets (25-50 mg total) by mouth at bedtime as needed. 60 tablet 2   ibuprofen (ADVIL,MOTRIN) 400 MG tablet Take 400 mg by mouth.     meloxicam (MOBIC) 15 MG tablet Take 1 tablet by mouth daily.     Na Sulfate-K  Sulfate-Mg Sulf (SUPREP BOWEL PREP KIT) 17.5-3.13-1.6 GM/177ML SOLN Take as directed per package instructions for 1 dose. 354 mL 0   phentermine (ADIPEX-P) 37.5 MG tablet Take 37.5 mg by mouth every morning.     phentermine (ADIPEX-P) 37.5 MG tablet Take 1 tablet (37.5 mg total) by mouth daily before breakfast. 30 tablet 2   Semaglutide-Weight Management (WEGOVY) 0.5 MG/0.5ML SOAJ Inject 0.5 mg into the skin every 7 (seven) days. 2 mL 0   Tenofovir Alafenamide Fumarate (VEMLIDY) 25 MG TABS Take 1 tablet (25 mg total) by mouth daily. 30 tablet 11   topiramate (TOPAMAX) 25 MG tablet Take 1 tablet (25 mg total) by mouth 2 (two) times daily. 60 tablet 2   topiramate (TOPAMAX) 25 MG tablet Take 1 tablet (25 mg total) by mouth 2 (two) times daily. 60 tablet 2   traZODone (DESYREL) 50 MG tablet Take 1 tablet (50 mg total) by mouth Nightly. 90 tablet 1   Vitamin D, Ergocalciferol, (DRISDOL) 1.25 MG (50000 UNIT) CAPS capsule Take 50,000 Units by mouth once a week.     Vitamin D, Ergocalciferol, (DRISDOL) 1.25 MG (50000 UNIT) CAPS capsule Take 1 capsule (50,000 Units total) by mouth once a week. 12 capsule 0   No current facility-administered medications for this visit.    REVIEW OF SYSTEMS:   Constitutional: ( - ) fevers, ( - )  chills , ( - ) night sweats Eyes: ( - ) blurriness of vision, ( - ) double vision, ( - ) watery eyes Ears, nose, mouth, throat, and face: ( - ) mucositis, ( - ) sore throat Respiratory: ( - ) cough, ( - ) dyspnea, ( - ) wheezes Cardiovascular: ( - ) palpitation, ( - ) chest discomfort, ( - ) lower extremity swelling Gastrointestinal:  ( - ) nausea, ( - ) heartburn, ( - ) change in bowel habits Skin: ( - ) abnormal skin rashes Lymphatics: ( - ) new lymphadenopathy, ( - ) easy bruising Neurological: ( - ) numbness, ( - ) tingling, ( - ) new weaknesses Behavioral/Psych: ( - ) mood change, ( - ) new changes  All other systems were reviewed with the patient and are  negative.  PHYSICAL EXAMINATION: ECOG PERFORMANCE STATUS: 1 - Symptomatic but completely ambulatory  Vitals:   12/22/22 1014  BP: 138/85  Pulse: 71  Resp: 14  Temp: 98 F (36.7 C)  SpO2: 100%   Filed Weights   12/22/22 1014  Weight: 199 lb 9.6 oz (90.5 kg)    GENERAL: Well-appearing middle-aged African-American female, alert, no distress and comfortable SKIN: skin color, texture, turgor are normal, no rashes or significant lesions EYES: conjunctiva are pink and non-injected, sclera clear LUNGS: clear to  auscultation and percussion with normal breathing effort HEART: regular rate & rhythm and no murmurs and no lower extremity edema Musculoskeletal: no cyanosis of digits and no clubbing  PSYCH: alert & oriented x 3, fluent speech NEURO: no focal motor/sensory deficits  LABORATORY DATA:  I have reviewed the data as listed    Latest Ref Rng & Units 12/22/2022    9:52 AM 09/16/2022   10:00 AM 03/23/2022   10:04 AM  CBC  WBC 4.0 - 10.5 K/uL 4.2  4.0  4.3   Hemoglobin 12.0 - 15.0 g/dL 16.1  09.6  04.5   Hematocrit 36.0 - 46.0 % 37.3  35.9  38.3   Platelets 150 - 400 K/uL 118  144  110        Latest Ref Rng & Units 12/22/2022    9:52 AM 09/16/2022   10:00 AM 03/23/2022   10:04 AM  CMP  Glucose 70 - 99 mg/dL 82  85  95   BUN 6 - 20 mg/dL 15  15  13    Creatinine 0.44 - 1.00 mg/dL 4.09  8.11  9.14   Sodium 135 - 145 mmol/L 140  141  138   Potassium 3.5 - 5.1 mmol/L 4.1  4.2  4.4   Chloride 98 - 111 mmol/L 107  110  106   CO2 22 - 32 mmol/L 28  24  29    Calcium 8.9 - 10.3 mg/dL 9.7  9.0  9.1   Total Protein 6.5 - 8.1 g/dL 7.2  6.8  7.4   Total Bilirubin 0.3 - 1.2 mg/dL 0.4  0.3  0.3   Alkaline Phos 38 - 126 U/L 105   104   AST 15 - 41 U/L 17  14  15    ALT 0 - 44 U/L 10  7    8  7     RADIOGRAPHIC STUDIES: No results found.  ASSESSMENT & PLAN Christine Mccarty 47 y.o. female with medical history significant for thrombocytopenia in setting of chronic Hep B infection who  presents for a follow up visit.  #Thrombocytopenia, mild # Chronic Hepatitis B  -- Known Hepatitis B infection, follows with ID. She is currently on Tenofovir.  -- At this time strong suspicion that her thrombocytopenia is secondary to her chronic hepatitis B.  We ruled out other possibilities with our prior workup.  --If not related to chronic hepatitis B this may be a mild ITP.  In that case her platelets are well above the treatment range.  --RTC in 1 year to re-evaluate.   # Iron Deficiency Anemia 2/2 to GYN Bleeding -- Findings are consistent with iron deficiency anemia secondary to patient's menorrhagia --Encouraged her to follow-up with OB/GYN for better control of her menstrual cycles --We will confirm iron deficiency anemia by ordering iron panel and ferritin as well as reticulocytes, CBC, and CMP --Continue ferrous sulfate 325 mg with a source of vitamin C.  Patient is taking this every other day. --We will plan to proceed with IV iron therapy in order to help bolster the patient's blood counts --Plan for return to clinic in 4 to 6 weeks time after last dose of IV iron    No orders of the defined types were placed in this encounter.   All questions were answered. The patient knows to call the clinic with any problems, questions or concerns.  A total of more than 30 minutes were spent on this encounter with face-to-face time and non-face-to-face time, including preparing to see  the patient, ordering tests and/or medications, counseling the patient and coordination of care as outlined above.   Ulysees Barns, MD Department of Hematology/Oncology [facility] Phone: [phone] Pager: [phone] Email: [email or web address]  12/31/2022 2:18 PM

## 2022-12-23 ENCOUNTER — Telehealth: Payer: Self-pay | Admitting: Pharmacy Technician

## 2022-12-23 NOTE — Telephone Encounter (Signed)
Dr. Leonides Schanz, Monoferric is non preferred and will be denied if patient has not failed or tried preferred medications.  Preferred medication is Venofer. Would you like to try venofer?

## 2022-12-25 ENCOUNTER — Encounter: Payer: Self-pay | Admitting: Hematology and Oncology

## 2022-12-29 ENCOUNTER — Other Ambulatory Visit (HOSPITAL_COMMUNITY): Payer: Self-pay

## 2022-12-31 ENCOUNTER — Encounter: Payer: Self-pay | Admitting: Hematology and Oncology

## 2023-01-02 ENCOUNTER — Other Ambulatory Visit: Payer: Self-pay

## 2023-01-09 ENCOUNTER — Other Ambulatory Visit: Payer: Self-pay

## 2023-01-12 ENCOUNTER — Ambulatory Visit: Payer: Commercial Managed Care - PPO

## 2023-01-26 ENCOUNTER — Ambulatory Visit: Payer: Commercial Managed Care - PPO

## 2023-01-31 ENCOUNTER — Other Ambulatory Visit (HOSPITAL_COMMUNITY): Payer: Self-pay

## 2023-02-03 ENCOUNTER — Other Ambulatory Visit (HOSPITAL_COMMUNITY): Payer: Self-pay

## 2023-02-06 ENCOUNTER — Other Ambulatory Visit (HOSPITAL_COMMUNITY): Payer: Self-pay

## 2023-02-06 ENCOUNTER — Encounter (HOSPITAL_COMMUNITY): Payer: Self-pay

## 2023-02-07 ENCOUNTER — Other Ambulatory Visit (HOSPITAL_COMMUNITY): Payer: Self-pay

## 2023-02-07 MED ORDER — HYDROXYZINE HCL 25 MG PO TABS
25.0000 mg | ORAL_TABLET | Freq: Every evening | ORAL | 0 refills | Status: DC | PRN
  Filled 2023-02-07: qty 60, 30d supply, fill #0

## 2023-02-14 ENCOUNTER — Other Ambulatory Visit (HOSPITAL_COMMUNITY): Payer: Self-pay

## 2023-02-14 MED ORDER — VITAMIN D (ERGOCALCIFEROL) 1.25 MG (50000 UNIT) PO CAPS
50000.0000 [IU] | ORAL_CAPSULE | ORAL | 0 refills | Status: DC
  Filled 2023-02-14: qty 12, 84d supply, fill #0

## 2023-02-15 ENCOUNTER — Other Ambulatory Visit (HOSPITAL_COMMUNITY): Payer: Self-pay

## 2023-02-27 ENCOUNTER — Other Ambulatory Visit (HOSPITAL_COMMUNITY): Payer: Self-pay

## 2023-03-01 ENCOUNTER — Other Ambulatory Visit (HOSPITAL_COMMUNITY): Payer: Self-pay

## 2023-03-03 ENCOUNTER — Other Ambulatory Visit (HOSPITAL_COMMUNITY): Payer: Self-pay

## 2023-03-03 ENCOUNTER — Encounter (HOSPITAL_COMMUNITY): Payer: Self-pay

## 2023-03-22 ENCOUNTER — Other Ambulatory Visit: Payer: Self-pay

## 2023-03-22 DIAGNOSIS — D696 Thrombocytopenia, unspecified: Secondary | ICD-10-CM

## 2023-03-22 DIAGNOSIS — D500 Iron deficiency anemia secondary to blood loss (chronic): Secondary | ICD-10-CM

## 2023-03-23 ENCOUNTER — Other Ambulatory Visit: Payer: Self-pay

## 2023-03-23 ENCOUNTER — Other Ambulatory Visit (HOSPITAL_COMMUNITY): Payer: Self-pay

## 2023-03-23 NOTE — Progress Notes (Signed)
Specialty Pharmacy Refill Coordination Note  Christine Mccarty is a 47 y.o. female contacted today regarding refills of specialty medication(s) Tenofovir Alafenamide Fumarate   Patient requested Delivery   Delivery date: 03/27/23   Verified address: [address], [place], [address]   Medication will be filled on 03/24/23.

## 2023-03-24 ENCOUNTER — Inpatient Hospital Stay: Payer: Commercial Managed Care - PPO | Attending: Hematology and Oncology

## 2023-03-24 ENCOUNTER — Inpatient Hospital Stay: Payer: Commercial Managed Care - PPO | Admitting: Hematology and Oncology

## 2023-03-27 ENCOUNTER — Other Ambulatory Visit: Payer: Self-pay

## 2023-03-27 DIAGNOSIS — B181 Chronic viral hepatitis B without delta-agent: Secondary | ICD-10-CM

## 2023-03-27 NOTE — Addendum Note (Signed)
Addended by: Harley Alto on: 03/27/2023 02:37 PM   Modules accepted: Orders

## 2023-03-28 ENCOUNTER — Other Ambulatory Visit: Payer: Commercial Managed Care - PPO

## 2023-03-29 ENCOUNTER — Encounter: Payer: Self-pay | Admitting: Hematology and Oncology

## 2023-04-13 ENCOUNTER — Ambulatory Visit: Payer: Commercial Managed Care - PPO | Admitting: Internal Medicine

## 2023-04-17 ENCOUNTER — Other Ambulatory Visit: Payer: Self-pay

## 2023-04-19 ENCOUNTER — Other Ambulatory Visit: Payer: Self-pay

## 2023-05-04 ENCOUNTER — Encounter: Payer: Self-pay | Admitting: Internal Medicine

## 2023-05-04 ENCOUNTER — Ambulatory Visit: Payer: Commercial Managed Care - PPO | Admitting: Internal Medicine

## 2023-05-04 ENCOUNTER — Other Ambulatory Visit: Payer: Self-pay

## 2023-05-04 VITALS — BP 120/77 | HR 66 | Resp 16 | Ht 68.0 in | Wt 206.0 lb

## 2023-05-04 DIAGNOSIS — B181 Chronic viral hepatitis B without delta-agent: Secondary | ICD-10-CM | POA: Diagnosis not present

## 2023-05-04 DIAGNOSIS — D696 Thrombocytopenia, unspecified: Secondary | ICD-10-CM

## 2023-05-04 DIAGNOSIS — Z5181 Encounter for therapeutic drug level monitoring: Secondary | ICD-10-CM | POA: Diagnosis not present

## 2023-05-04 NOTE — Progress Notes (Signed)
History of Present Illness The patient, with a history of Hepatitis B, has been managed on Vemlidy with no reported side effects. She has been compliant with the medication and her viral load has been undetectable for a while. She also has a history of fatty liver disease, diagnosed a year ago via ultrasound. The patient has been making lifestyle modifications, including diet and exercise, to manage this condition. She occasionally consumes alcohol, specifically wine, and is concerned about its impact on her liver health. The patient also has a high intake of carbohydrates and sugars, including coffee creamers and starchy foods like rice.  Medications - Vemlidy  Social History - Patient drinks a glass of wine once in a while  Physical Exam Gen: alert, nad Respiratory: normal respiratory effort  Results LABS Hepatitis B Virus: not detected  RADIOLOGY Liver Ultrasound: hepatic steatosis (05/03/2022)  Assessment & Plan Hepatitis B Viral load undetectable on Vemlidy. No reported side effects. -Continue Vemlidy -Order labs today to monitor viral load and liver function. -check ultrasound with elastography for ? liver fibrosis  Fatty Liver Noted on ultrasound a year ago. Patient has questions about dietary management. -Order ultrasound with elastography to assess liver stiffness. -Advise patient on dietary modifications, focusing on reducing carbohydrate and sugar intake. -Encourage regular exercise.  General Health Maintenance / Followup Plans -Plan to see patient twice a year for ongoing management of Hepatitis B. -Increase frequency of ultrasounds to monitor for potential liver cancer. -Schedule follow-up in six months.   At risk for cancer -will continue with HCC screening  Thrombocytopenia  -has been low but wnl last visit -will recheck  Vaccines  -flu shot today

## 2023-05-08 LAB — COMPLETE METABOLIC PANEL WITH GFR
AG Ratio: 1.6 (calc) (ref 1.0–2.5)
ALT: 12 U/L (ref 6–29)
AST: 17 U/L (ref 10–35)
Albumin: 4.4 g/dL (ref 3.6–5.1)
Alkaline phosphatase (APISO): 122 U/L (ref 31–125)
BUN/Creatinine Ratio: 15 (calc) (ref 6–22)
BUN: 16 mg/dL (ref 7–25)
CO2: 26 mmol/L (ref 20–32)
Calcium: 9.6 mg/dL (ref 8.6–10.2)
Chloride: 105 mmol/L (ref 98–110)
Creat: 1.06 mg/dL — ABNORMAL HIGH (ref 0.50–0.99)
Globulin: 2.7 g/dL (ref 1.9–3.7)
Glucose, Bld: 82 mg/dL (ref 65–99)
Potassium: 4.6 mmol/L (ref 3.5–5.3)
Sodium: 139 mmol/L (ref 135–146)
Total Bilirubin: 0.4 mg/dL (ref 0.2–1.2)
Total Protein: 7.1 g/dL (ref 6.1–8.1)
eGFR: 64 mL/min/{1.73_m2} (ref >=60)

## 2023-05-08 LAB — HEPATITIS B DNA, ULTRAQUANTITATIVE, PCR
Hepatitis B DNA: NOT DETECTED [IU]/mL
Hepatitis B virus DNA: NOT DETECTED {Log}

## 2023-05-08 LAB — CBC
HCT: 38.5 % (ref 35.0–45.0)
Hemoglobin: 12.5 g/dL (ref 11.7–15.5)
MCH: 27.4 pg (ref 27.0–33.0)
MCHC: 32.5 g/dL (ref 32.0–36.0)
MCV: 84.4 fL (ref 80.0–100.0)
MPV: 12.6 fL — ABNORMAL HIGH (ref 7.5–12.5)
Platelets: 116 10*3/uL — ABNORMAL LOW (ref 140–400)
RBC: 4.56 10*6/uL (ref 3.80–5.10)
RDW: 13.7 % (ref 11.0–15.0)
WBC: 3.6 10*3/uL — ABNORMAL LOW (ref 3.8–10.8)

## 2023-05-10 ENCOUNTER — Ambulatory Visit (HOSPITAL_COMMUNITY): Admission: RE | Admit: 2023-05-10 | Payer: Commercial Managed Care - PPO | Source: Ambulatory Visit

## 2023-05-15 ENCOUNTER — Other Ambulatory Visit: Payer: Self-pay

## 2023-05-15 NOTE — Progress Notes (Signed)
Specialty Pharmacy Refill Coordination Note  Christine Mccarty is a 47 y.o. female contacted today regarding refills of specialty medication(s) Tenofovir Alafenamide Fumarate   Patient requested Delivery   Delivery date: 06/01/23   Verified address: [address] [place], [address]   Medication will be filled on 05/31/23.

## 2023-05-18 ENCOUNTER — Ambulatory Visit (HOSPITAL_COMMUNITY): Payer: Commercial Managed Care - PPO

## 2023-05-24 DIAGNOSIS — D61818 Other pancytopenia: Secondary | ICD-10-CM | POA: Diagnosis not present

## 2023-05-24 DIAGNOSIS — Z6830 Body mass index (BMI) 30.0-30.9, adult: Secondary | ICD-10-CM | POA: Diagnosis not present

## 2023-05-24 DIAGNOSIS — Z0000 Encounter for general adult medical examination without abnormal findings: Secondary | ICD-10-CM | POA: Diagnosis not present

## 2023-05-24 DIAGNOSIS — E66811 Obesity, class 1: Secondary | ICD-10-CM | POA: Diagnosis not present

## 2023-05-24 DIAGNOSIS — E7800 Pure hypercholesterolemia, unspecified: Secondary | ICD-10-CM | POA: Diagnosis not present

## 2023-05-24 DIAGNOSIS — E559 Vitamin D deficiency, unspecified: Secondary | ICD-10-CM | POA: Diagnosis not present

## 2023-05-25 ENCOUNTER — Other Ambulatory Visit (HOSPITAL_COMMUNITY): Payer: Self-pay

## 2023-05-25 ENCOUNTER — Ambulatory Visit (HOSPITAL_COMMUNITY)
Admission: RE | Admit: 2023-05-25 | Discharge: 2023-05-25 | Disposition: A | Discharge status: Home / Self Care | Payer: Commercial Managed Care - PPO | Source: Ambulatory Visit | Attending: Internal Medicine | Admitting: Internal Medicine

## 2023-05-25 ENCOUNTER — Encounter: Payer: Self-pay | Admitting: Hematology and Oncology

## 2023-05-25 ENCOUNTER — Other Ambulatory Visit: Payer: Self-pay

## 2023-05-25 ENCOUNTER — Other Ambulatory Visit: Payer: Self-pay | Admitting: Hematology and Oncology

## 2023-05-25 ENCOUNTER — Telehealth: Payer: Self-pay | Admitting: *Deleted

## 2023-05-25 DIAGNOSIS — B181 Chronic viral hepatitis B without delta-agent: Secondary | ICD-10-CM | POA: Diagnosis not present

## 2023-05-25 DIAGNOSIS — Z5181 Encounter for therapeutic drug level monitoring: Secondary | ICD-10-CM | POA: Insufficient documentation

## 2023-05-25 DIAGNOSIS — R932 Abnormal findings on diagnostic imaging of liver and biliary tract: Secondary | ICD-10-CM | POA: Diagnosis not present

## 2023-05-25 DIAGNOSIS — B182 Chronic viral hepatitis C: Secondary | ICD-10-CM | POA: Diagnosis not present

## 2023-05-25 DIAGNOSIS — Z944 Liver transplant status: Secondary | ICD-10-CM | POA: Diagnosis not present

## 2023-05-25 DIAGNOSIS — D696 Thrombocytopenia, unspecified: Secondary | ICD-10-CM | POA: Diagnosis not present

## 2023-05-25 DIAGNOSIS — Z9049 Acquired absence of other specified parts of digestive tract: Secondary | ICD-10-CM | POA: Diagnosis not present

## 2023-05-25 MED ORDER — TOPIRAMATE 25 MG PO TABS
25.0000 mg | ORAL_TABLET | Freq: Two times a day (BID) | ORAL | 2 refills | Status: AC
  Filled 2023-05-25: qty 60, 30d supply, fill #0
  Filled 2023-11-08: qty 60, 30d supply, fill #1

## 2023-05-25 MED ORDER — WEGOVY 0.25 MG/0.5ML ~~LOC~~ SOAJ
0.2500 mg | SUBCUTANEOUS | 0 refills | Status: AC
  Filled 2024-02-23: qty 2, 28d supply, fill #0

## 2023-05-25 MED ORDER — HYDROXYZINE HCL 25 MG PO TABS
25.0000 mg | ORAL_TABLET | Freq: Every evening | ORAL | 0 refills | Status: DC | PRN
  Filled 2023-05-25: qty 60, 30d supply, fill #0

## 2023-05-25 MED ORDER — VITAMIN D (ERGOCALCIFEROL) 1.25 MG (50000 UNIT) PO CAPS
50000.0000 [IU] | ORAL_CAPSULE | ORAL | 0 refills | Status: AC
  Filled 2023-05-25: qty 12, 84d supply, fill #0

## 2023-05-25 MED ORDER — FERROUS SULFATE 325 (65 FE) MG PO TABS
325.0000 mg | ORAL_TABLET | Freq: Every day | ORAL | 0 refills | Status: DC
  Filled 2023-05-25: qty 90, 90d supply, fill #0

## 2023-05-25 MED ORDER — VITAMIN D (ERGOCALCIFEROL) 1.25 MG (50000 UNIT) PO CAPS
50000.0000 [IU] | ORAL_CAPSULE | ORAL | 0 refills | Status: DC
  Filled 2023-05-25: qty 12, 84d supply, fill #0

## 2023-05-25 NOTE — Telephone Encounter (Signed)
Received vm message from pt stating that she had labs done yesterday and her iron levels are down again. She states she is very fatigued. She is asking to have an iron infusion. She states she was unable to get the IV iron back in August as she had to go out of town for a family funeral.  Labs were done yesterday, 05/24/23

## 2023-05-26 ENCOUNTER — Other Ambulatory Visit: Payer: Self-pay

## 2023-05-30 ENCOUNTER — Telehealth: Payer: Self-pay

## 2023-05-30 ENCOUNTER — Other Ambulatory Visit: Payer: Self-pay

## 2023-05-30 NOTE — Telephone Encounter (Signed)
Dr. Leonides Schanz, please send a new referral for Venofer or Feraheme.  Auth Submission: DENIED Site of care: Site of care: [facility] Payer: [employer] Medication & CPT/J Code(s) submitted: Monoferric (Ferrci derisomaltose) [phone] Route of submission (phone, fax, portal): portal  Authorization has been DENIED because the patient must try and fail Venofer and Feraheme first. Please send a new referral if you would like to try one of these medications.

## 2023-05-31 ENCOUNTER — Other Ambulatory Visit: Payer: Self-pay

## 2023-05-31 ENCOUNTER — Telehealth: Payer: Self-pay | Admitting: Hematology and Oncology

## 2023-06-04 ENCOUNTER — Emergency Department (HOSPITAL_BASED_OUTPATIENT_CLINIC_OR_DEPARTMENT_OTHER)
Admission: EM | Admit: 2023-06-04 | Discharge: 2023-06-04 | Disposition: A | Discharge status: Home / Self Care | Payer: Commercial Managed Care - PPO | Attending: Emergency Medicine | Admitting: Emergency Medicine

## 2023-06-04 ENCOUNTER — Encounter (HOSPITAL_BASED_OUTPATIENT_CLINIC_OR_DEPARTMENT_OTHER): Payer: Self-pay | Admitting: Emergency Medicine

## 2023-06-04 ENCOUNTER — Other Ambulatory Visit: Payer: Self-pay

## 2023-06-04 DIAGNOSIS — T50905A Adverse effect of unspecified drugs, medicaments and biological substances, initial encounter: Secondary | ICD-10-CM | POA: Diagnosis not present

## 2023-06-04 DIAGNOSIS — R112 Nausea with vomiting, unspecified: Secondary | ICD-10-CM | POA: Insufficient documentation

## 2023-06-04 LAB — URINALYSIS, ROUTINE W REFLEX MICROSCOPIC
Bacteria, UA: NONE SEEN
Bilirubin Urine: NEGATIVE
Glucose, UA: NEGATIVE mg/dL
Hgb urine dipstick: NEGATIVE
Ketones, ur: 40 mg/dL — AB
Leukocytes,Ua: NEGATIVE
Nitrite: NEGATIVE
Protein, ur: 30 mg/dL — AB
Specific Gravity, Urine: 1.028 (ref 1.005–1.030)
pH: 6 (ref 5.0–8.0)

## 2023-06-04 LAB — COMPREHENSIVE METABOLIC PANEL
ALT: 11 U/L (ref 0–44)
AST: 20 U/L (ref 15–41)
Albumin: 4.9 g/dL (ref 3.5–5.0)
Alkaline Phosphatase: 110 U/L (ref 38–126)
Anion gap: 9 (ref 5–15)
BUN: 18 mg/dL (ref 6–20)
CO2: 27 mmol/L (ref 22–32)
Calcium: 9.8 mg/dL (ref 8.9–10.3)
Chloride: 103 mmol/L (ref 98–111)
Creatinine, Ser: 0.85 mg/dL (ref 0.44–1.00)
GFR, Estimated: >60 mL/min (ref >60)
Glucose, Bld: 79 mg/dL (ref 70–99)
Potassium: 4 mmol/L (ref 3.5–5.1)
Sodium: 139 mmol/L (ref 135–145)
Total Bilirubin: 0.5 mg/dL (ref <1.2)
Total Protein: 8.4 g/dL — ABNORMAL HIGH (ref 6.5–8.1)

## 2023-06-04 LAB — CBC
HCT: 46 % (ref 36.0–46.0)
Hemoglobin: 15.1 g/dL — ABNORMAL HIGH (ref 12.0–15.0)
MCH: 27.8 pg (ref 26.0–34.0)
MCHC: 32.8 g/dL (ref 30.0–36.0)
MCV: 84.7 fL (ref 80.0–100.0)
Platelets: 139 10*3/uL — ABNORMAL LOW (ref 150–400)
RBC: 5.43 MIL/uL — ABNORMAL HIGH (ref 3.87–5.11)
RDW: 13.9 % (ref 11.5–15.5)
WBC: 6.2 10*3/uL (ref 4.0–10.5)
nRBC: 0 % (ref 0.0–0.2)

## 2023-06-04 LAB — PREGNANCY, URINE: Preg Test, Ur: NEGATIVE

## 2023-06-04 LAB — LIPASE, BLOOD: Lipase: 23 U/L (ref 11–51)

## 2023-06-04 MED ORDER — ONDANSETRON HCL 4 MG/2ML IJ SOLN
4.0000 mg | Freq: Once | INTRAMUSCULAR | Status: AC
  Administered 2023-06-04: 4 mg via INTRAVENOUS
  Filled 2023-06-04: qty 2

## 2023-06-04 MED ORDER — ONDANSETRON 4 MG PO TBDP: 4.0000 mg | ORAL_TABLET | Freq: Three times a day (TID) | ORAL | 0 refills | Status: DC | PRN

## 2023-06-04 NOTE — ED Triage Notes (Signed)
N/v and abdo pain x 2 days Suspected response to semiglutide medication.

## 2023-06-04 NOTE — Discharge Instructions (Signed)
You are seen in the emergency department today for concerns of abdominal pain, nausea and vomiting.  I believe this is likely a side effect to the semaglutide injection that he had received.  I would recommend discontinuing this medication as this is a known side effect and may be causing your persistent symptoms.  A prescription for Zofran has been sent to your pharmacy for continued nausea control as needed.  If symptoms are worsening, please return the emergency department.  You should discuss any continuation of the semaglutide with your primary care provider.

## 2023-06-04 NOTE — ED Provider Notes (Signed)
 [place] EMERGENCY DEPARTMENT AT [facility] Provider Note   CSN: [id number] Arrival date & time: 06/04/23  1408     History Chief Complaint  Patient presents with   Abdominal Pain    Christine Mccarty is a 47 y.o. female.  Patient past history significant for obesity, chronic otitis B presents the emergency department concerns of nausea vomiting and abdominal discomfort for the last 2 days.  She reports that she took an initial dose of semaglutide yesterday for weight loss.  This is the first time she had taken this medication.  Endorsing that about 12 hours after ministration of medication, she is having significant nausea and vomiting.  Having a difficult time tolerating p.o.  Denies any diarrhea, fever, chills, shortness of breath, or hematemesis or hematochezia.   Abdominal Pain      Home Medications Prior to Admission medications   Medication Sig Start Date End Date Taking? Authorizing Provider  ondansetron (ZOFRAN-ODT) 4 MG disintegrating tablet Take 1 tablet (4 mg total) by mouth every 8 (eight) hours as needed for nausea or vomiting. 06/04/23  Yes Danisa Kopec A, PA-C  ferrous sulfate (FERROUSUL) 325 (65 FE) MG tablet Take 1 tablet (325 mg total) by mouth daily with breakfast. Take daily with a source of Vitamin C for improved absorption. 05/09/22   Jaci Standard, MD  ferrous sulfate 325 (65 FE) MG tablet Take 1 tablet (325 mg total) by mouth daily. 05/25/23     hydrocortisone (ANUSOL-HC) 2.5 % rectal cream Place rectally 2 times daily. For 1 week 05/18/22     hydrOXYzine (ATARAX) 25 MG tablet Take 1-2 tablets (25-50 mg total) by mouth at bedtime as needed. 05/25/23     ibuprofen (ADVIL,MOTRIN) 400 MG tablet Take 400 mg by mouth.    [provider]  meloxicam (MOBIC) 15 MG tablet Take 1 tablet by mouth daily. 05/02/22   [provider]  Na Sulfate-K Sulfate-Mg Sulf (SUPREP BOWEL PREP KIT) 17.5-3.13-1.6 GM/177ML SOLN Take as directed per package  instructions for 1 dose. 05/30/22     phentermine (ADIPEX-P) 37.5 MG tablet Take 37.5 mg by mouth every morning. 02/21/22   [provider]  Semaglutide-Weight Management (WEGOVY) 0.25 MG/0.5ML SOAJ Inject 0.25 mg into the skin every 7 (seven) days. 05/24/23     Semaglutide-Weight Management (WEGOVY) 0.5 MG/0.5ML SOAJ Inject 0.5 mg into the skin every 7 (seven) days. 06/08/22     tenofovir alafenamide (VEMLIDY) 25 MG tablet Take 1 tablet (25 mg total) by mouth daily. 09/29/22   Cliffton Asters, MD  topiramate (TOPAMAX) 25 MG tablet Take 1 tablet (25 mg total) by mouth 2 (two) times daily. 05/25/23     traZODone (DESYREL) 50 MG tablet Take 1 tablet (50 mg total) by mouth Nightly. 05/18/22     Vitamin D, Ergocalciferol, (DRISDOL) 1.25 MG (50000 UNIT) CAPS capsule Take 50,000 Units by mouth once a week. 11/19/20   [provider]  Vitamin D, Ergocalciferol, (DRISDOL) 1.25 MG (50000 UNIT) CAPS capsule Take 1 capsule (50,000 Units total) by mouth once a week. 05/25/23     Vitamin D, Ergocalciferol, (DRISDOL) 1.25 MG (50000 UNIT) CAPS capsule Take 1 capsule (50,000 Units total) by mouth once a week. 05/25/23     mirtazapine (REMERON) 15 MG tablet Take 1 tablet (15 mg total) by mouth nightly 05/30/22 06/08/22        Allergies    Prednisone    Review of Systems   Review of Systems  Gastrointestinal:  Positive for  abdominal pain.  All other systems reviewed and are negative.   Physical Exam Updated Vital Signs BP (!) 145/105 (BP Location: Right Arm)   Pulse 96   Temp 98.3 F (36.8 C) (Oral)   Resp 20   LMP 05/27/2023 (Approximate)   SpO2 100%  Physical Exam Vitals and nursing note reviewed.  Constitutional:      General: She is not in acute distress.    Appearance: She is well-developed.  HENT:     Head: Normocephalic and atraumatic.  Eyes:     Conjunctiva/sclera: Conjunctivae normal.  Cardiovascular:     Rate and Rhythm: Normal rate and regular rhythm.     Heart sounds:  No murmur heard. Pulmonary:     Effort: Pulmonary effort is normal. No respiratory distress.     Breath sounds: Normal breath sounds.  Abdominal:     Palpations: Abdomen is soft.     Tenderness: There is no abdominal tenderness. There is no right CVA tenderness, left CVA tenderness or guarding.  Musculoskeletal:        General: No swelling.     Cervical back: Neck supple.  Skin:    General: Skin is warm and dry.     Capillary Refill: Capillary refill takes less than 2 seconds.  Neurological:     Mental Status: She is alert.  Psychiatric:        Mood and Affect: Mood normal.     ED Results / Procedures / Treatments   Labs (all labs ordered are listed, but only abnormal results are displayed) Labs Reviewed  COMPREHENSIVE METABOLIC PANEL - Abnormal; Notable for the following components:      Result Value   Total Protein 8.4 (*)    All other components within normal limits  CBC - Abnormal; Notable for the following components:   RBC 5.43 (*)    Hemoglobin 15.1 (*)    Platelets 139 (*)    All other components within normal limits  URINALYSIS, ROUTINE W REFLEX MICROSCOPIC - Abnormal; Notable for the following components:   Ketones, ur 40 (*)    Protein, ur 30 (*)    All other components within normal limits  LIPASE, BLOOD  PREGNANCY, URINE    EKG None  Radiology No results found.  Procedures Procedures   Medications Ordered in ED Medications  ondansetron (ZOFRAN) injection 4 mg (4 mg Intravenous Given 06/04/23 1546)    ED Course/ Medical Decision Making/ A&P                                 Medical Decision Making Amount and/or Complexity of Data Reviewed Labs: ordered.  Risk Prescription drug management.   This patient presents to the ED for concern of abdominal pain.  Differential diagnosis includes medication reaction to semaglutide, gastroparesis, bowel obstruction, gastroenteritis   Lab Tests:  I Ordered, and personally interpreted labs.  The  pertinent results include: CBC with some hemoconcentration, urine pregnancy negative, UA without infection, lipase negative, CMP without acute findings to suggest dehydration or AKI   Medicines ordered and prescription drug management:  I ordered medication including Zofran for nausea Reevaluation of the patient after these medicines showed that the patient improved I have reviewed the patients home medicines and have made adjustments as needed   Problem List / ED Course:  Patient with past history significant for obesity and chronic appetite C presents the emergency department with concerns of abdominal pain.  She reports that she administered dose of some yesterday in the morning and began to have subsequent nausea and vomiting later in the day.  Denies ability to tolerate oral intake at this time.  No diarrhea, fever or chills.  This is the first time that she is taking this medication and feels that this is all a medication side effect.  Denies any sick contacts recently.  No hematemesis, medic easier, or any evidence of GI bleed. Will attempt to administer Zofran for nausea control.  Advised patient to try to tolerate oral intake but she can after Zofran ministration.  Will reassess patient shortly.  Not acutely indicating dehydration requiring fluid resuscitation at this time as vitals are unremarkable and CMP is reassuring. Patient responded well to Zofran.  On reassessment she appears well-appearing and tolerating oral intake.  I suspect patient did experience a medication reaction to the semaglutide as this is a known side effect.  Advised patient to discontinue this medication and follow-up with PCP for further management and discussion for any interventions for weight loss as needed.  No other acute concerns at this time and patient had reassuring workup.  Will discharge home with outpatient follow-up.  Prescription for Zofran sent at patient's pharmacy.  Discharged home in stable  condition.  Final Clinical Impression(s) / ED Diagnoses Final diagnoses:  Medication side effect, initial encounter  Nausea and vomiting, unspecified vomiting type    Rx / DC Orders ED Discharge Orders          Ordered    ondansetron (ZOFRAN-ODT) 4 MG disintegrating tablet  Every 8 hours PRN        06/04/23 1737              Smitty Knudsen, PA-C 06/04/23 1741    Virgina Norfolk, DO 06/05/23 [phone]

## 2023-06-04 NOTE — ED Notes (Signed)
Reviewed AVS/discharge instruction with patient. Time allotted for and all questions answered. Patient is agreeable for d/c and escorted to ed exit by staff.  

## 2023-06-06 ENCOUNTER — Other Ambulatory Visit (HOSPITAL_COMMUNITY): Payer: Self-pay

## 2023-06-06 ENCOUNTER — Telehealth (HOSPITAL_BASED_OUTPATIENT_CLINIC_OR_DEPARTMENT_OTHER): Payer: Self-pay | Admitting: Emergency Medicine

## 2023-06-06 MED ORDER — ONDANSETRON 4 MG PO TBDP
4.0000 mg | ORAL_TABLET | Freq: Three times a day (TID) | ORAL | 0 refills | Status: AC | PRN
  Filled 2023-06-06 (×2): qty 20, 7d supply, fill #0

## 2023-06-06 NOTE — Telephone Encounter (Signed)
Received notification from nursing that the patient's Zofran which have been prescribed after an ER visit on 12/22 had not been filled, patient requesting prescription updated and sent to the [facility].

## 2023-06-08 ENCOUNTER — Other Ambulatory Visit: Payer: Self-pay

## 2023-06-08 ENCOUNTER — Telehealth: Payer: Self-pay | Admitting: Pharmacy Technician

## 2023-06-08 NOTE — Telephone Encounter (Signed)
Dr. Leonides Schanz,  Insurance is requesting we use Venofer (preferred med).   Feraheme and Monoferric are non-preferred.  Would you like to use Venofer?  Auth Submission: NO AUTH NEEDED Site of care: Site of care: [facility] Payer: [employer] Medication & CPT/J Code(s) submitted: Venofer (Iron Sucrose) J1756 Route of submission (phone, fax, portal):  Phone # Fax # Auth type: Buy/Bill PB Units/visits requested:  Reference number: [id number] Approval from: 06/08/23 to 07/14/23

## 2023-06-22 ENCOUNTER — Other Ambulatory Visit: Payer: Self-pay

## 2023-06-22 ENCOUNTER — Ambulatory Visit: Payer: Commercial Managed Care - PPO

## 2023-06-22 ENCOUNTER — Other Ambulatory Visit (HOSPITAL_COMMUNITY): Payer: Self-pay

## 2023-06-27 ENCOUNTER — Other Ambulatory Visit: Payer: Self-pay

## 2023-06-28 ENCOUNTER — Other Ambulatory Visit: Payer: Self-pay | Admitting: Pharmacist

## 2023-06-28 NOTE — Progress Notes (Signed)
 Specialty Pharmacy Ongoing Clinical Assessment Note  Christine Mccarty is a 48 y.o. female who is being followed by the specialty pharmacy service for RxSp Hepatitis B   Patient's specialty medication(s) reviewed today: Tenofovir  Alafenamide Fumarate (Vemlidy )   Missed doses in the last 4 weeks: 0   Patient/Caregiver did not have any additional questions or concerns.   Therapeutic benefit summary: Patient is achieving benefit   Adverse events/side effects summary: No adverse events/side effects   Patient's therapy is appropriate to: Continue    Goals Addressed             This Visit's Progress    Achieve sustained HBV viral load suppression       Patient is on track. Patient will maintain adherence         Follow up:  6 months  Sonya Duster Specialty Pharmacist

## 2023-06-29 MED ORDER — ACETAMINOPHEN 325 MG PO TABS
650.0000 mg | ORAL_TABLET | Freq: Once | ORAL | Status: DC
Start: 2023-06-29 — End: 2023-06-29

## 2023-06-29 MED ORDER — IRON SUCROSE 20 MG/ML IV SOLN
200.0000 mg | Freq: Once | INTRAVENOUS | Status: DC
Start: 2023-06-29 — End: 2023-06-29

## 2023-06-29 MED ORDER — DIPHENHYDRAMINE HCL 25 MG PO CAPS: 25.0000 mg | ORAL_CAPSULE | Freq: Once | ORAL | Status: DC

## 2023-07-04 ENCOUNTER — Other Ambulatory Visit: Payer: Self-pay

## 2023-07-04 ENCOUNTER — Other Ambulatory Visit (HOSPITAL_COMMUNITY): Payer: Self-pay

## 2023-07-04 NOTE — Progress Notes (Signed)
Specialty Pharmacy Refill Coordination Note  Christine Mccarty is a 48 y.o. female contacted today regarding refills of specialty medication(s) Tenofovir Alafenamide Fumarate [name]   Patient requested Delivery   Delivery date: 07/06/23   Verified address: [address] [name]   Medication will be filled on 07/05/23.

## 2023-07-06 ENCOUNTER — Ambulatory Visit: Payer: Commercial Managed Care - PPO

## 2023-07-06 MED ORDER — DIPHENHYDRAMINE HCL 25 MG PO CAPS: 25.0000 mg | ORAL_CAPSULE | Freq: Once | ORAL | Status: AC

## 2023-07-06 MED ORDER — IRON SUCROSE 20 MG/ML IV SOLN: 200.0000 mg | Freq: Once | INTRAVENOUS | Status: AC

## 2023-07-06 MED ORDER — ACETAMINOPHEN 325 MG PO TABS: 650.0000 mg | ORAL_TABLET | Freq: Once | ORAL | Status: AC

## 2023-07-13 MED ORDER — DIPHENHYDRAMINE HCL 25 MG PO CAPS: 25.0000 mg | ORAL_CAPSULE | Freq: Once | ORAL | Status: DC

## 2023-07-13 MED ORDER — ACETAMINOPHEN 325 MG PO TABS: 650.0000 mg | ORAL_TABLET | Freq: Once | ORAL | Status: DC

## 2023-07-13 MED ORDER — IRON SUCROSE 20 MG/ML IV SOLN: 200.0000 mg | Freq: Once | INTRAVENOUS | Status: DC

## 2023-07-18 ENCOUNTER — Other Ambulatory Visit (HOSPITAL_COMMUNITY): Payer: Self-pay

## 2023-07-18 ENCOUNTER — Other Ambulatory Visit: Payer: Self-pay

## 2023-07-18 MED ORDER — TOPIRAMATE 25 MG PO TABS
50.0000 mg | ORAL_TABLET | Freq: Every day | ORAL | 2 refills | Status: DC
  Filled 2023-07-18 (×2): qty 60, 30d supply, fill #0
  Filled 2023-09-12 – 2024-04-05 (×3): qty 60, 30d supply, fill #1

## 2023-07-18 MED ORDER — PHENTERMINE HCL 37.5 MG PO TABS
ORAL_TABLET | ORAL | 0 refills | Status: DC
  Filled 2023-07-18 – 2023-07-20 (×2): qty 30, 30d supply, fill #0

## 2023-07-20 ENCOUNTER — Ambulatory Visit: Payer: Commercial Managed Care - PPO

## 2023-07-20 ENCOUNTER — Other Ambulatory Visit (HOSPITAL_COMMUNITY): Payer: Self-pay

## 2023-07-20 DIAGNOSIS — Z1231 Encounter for screening mammogram for malignant neoplasm of breast: Secondary | ICD-10-CM | POA: Diagnosis not present

## 2023-07-20 DIAGNOSIS — Z01419 Encounter for gynecological examination (general) (routine) without abnormal findings: Secondary | ICD-10-CM | POA: Diagnosis not present

## 2023-07-21 ENCOUNTER — Other Ambulatory Visit (HOSPITAL_COMMUNITY): Payer: Self-pay

## 2023-07-27 ENCOUNTER — Other Ambulatory Visit: Payer: Self-pay

## 2023-07-27 ENCOUNTER — Ambulatory Visit: Payer: Commercial Managed Care - PPO

## 2023-07-28 ENCOUNTER — Other Ambulatory Visit: Payer: Self-pay

## 2023-08-21 ENCOUNTER — Other Ambulatory Visit: Payer: Self-pay

## 2023-08-29 ENCOUNTER — Other Ambulatory Visit (HOSPITAL_COMMUNITY): Payer: Self-pay

## 2023-08-29 ENCOUNTER — Other Ambulatory Visit: Payer: Self-pay

## 2023-08-29 NOTE — Progress Notes (Signed)
 Specialty Pharmacy Refill Coordination Note  Christine Mccarty is a 48 y.o. female contacted today regarding refills of specialty medication(s) Tenofovir Alafenamide Fumarate [name]   Patient requested Pickup at [facility] at [place] date: 08/30/23   Medication will be filled on 08/30/23.

## 2023-08-30 ENCOUNTER — Other Ambulatory Visit: Payer: Self-pay

## 2023-08-30 ENCOUNTER — Inpatient Hospital Stay: Payer: Commercial Managed Care - PPO | Attending: Hematology and Oncology

## 2023-08-30 ENCOUNTER — Other Ambulatory Visit: Payer: Self-pay | Admitting: Hematology and Oncology

## 2023-08-30 DIAGNOSIS — D696 Thrombocytopenia, unspecified: Secondary | ICD-10-CM

## 2023-08-30 DIAGNOSIS — D500 Iron deficiency anemia secondary to blood loss (chronic): Secondary | ICD-10-CM

## 2023-09-06 ENCOUNTER — Telehealth: Payer: Self-pay | Admitting: Hematology and Oncology

## 2023-09-06 NOTE — Telephone Encounter (Signed)
 Called to reschedule lab appointment due to patient no show. The patient decided to cancel appointment and said she will call back to reschedule at a later time.

## 2023-09-07 ENCOUNTER — Inpatient Hospital Stay: Payer: Commercial Managed Care - PPO | Admitting: Hematology and Oncology

## 2023-09-12 ENCOUNTER — Other Ambulatory Visit (HOSPITAL_COMMUNITY): Payer: Self-pay

## 2023-09-12 ENCOUNTER — Other Ambulatory Visit: Payer: Self-pay

## 2023-09-12 MED ORDER — PHENTERMINE HCL 37.5 MG PO TABS
37.5000 mg | ORAL_TABLET | Freq: Every day | ORAL | 0 refills | Status: DC
  Filled 2023-09-12: qty 30, 30d supply, fill #0

## 2023-09-12 MED ORDER — HYDROXYZINE HCL 25 MG PO TABS
25.0000 mg | ORAL_TABLET | Freq: Every evening | ORAL | 0 refills | Status: DC | PRN
  Filled 2023-09-12: qty 60, 30d supply, fill #0

## 2023-09-12 MED ORDER — VITAMIN D (ERGOCALCIFEROL) 1.25 MG (50000 UNIT) PO CAPS
50000.0000 [IU] | ORAL_CAPSULE | ORAL | 0 refills | Status: AC
  Filled 2023-09-12 – 2023-11-08 (×2): qty 12, 84d supply, fill #0

## 2023-09-13 ENCOUNTER — Other Ambulatory Visit: Payer: Self-pay

## 2023-09-13 ENCOUNTER — Other Ambulatory Visit (HOSPITAL_COMMUNITY): Payer: Self-pay

## 2023-09-25 ENCOUNTER — Other Ambulatory Visit: Payer: Self-pay

## 2023-09-25 ENCOUNTER — Other Ambulatory Visit (HOSPITAL_COMMUNITY): Payer: Self-pay

## 2023-09-27 ENCOUNTER — Other Ambulatory Visit: Payer: Self-pay

## 2023-09-28 ENCOUNTER — Other Ambulatory Visit: Payer: Self-pay

## 2023-09-28 NOTE — Progress Notes (Signed)
 Specialty Pharmacy Refill Coordination Note  Christine Mccarty is a 48 y.o. female contacted today regarding refills of specialty medication(s) Tenofovir Alafenamide Fumarate [facility])   Patient requested Delivery   Delivery date: 10/02/23   Verified address: [address] [place] [address]   Medication will be filled on 04.18.25.

## 2023-09-29 ENCOUNTER — Other Ambulatory Visit: Payer: Self-pay

## 2023-10-06 ENCOUNTER — Other Ambulatory Visit (HOSPITAL_COMMUNITY): Payer: Self-pay

## 2023-10-06 ENCOUNTER — Other Ambulatory Visit: Payer: Self-pay

## 2023-10-06 MED ORDER — METFORMIN HCL 850 MG PO TABS
ORAL_TABLET | ORAL | 0 refills | Status: AC
  Filled 2023-10-06: qty 120, 67d supply, fill #0

## 2023-10-07 ENCOUNTER — Other Ambulatory Visit (HOSPITAL_COMMUNITY): Payer: Self-pay

## 2023-10-11 ENCOUNTER — Other Ambulatory Visit: Payer: Self-pay

## 2023-10-11 DIAGNOSIS — B181 Chronic viral hepatitis B without delta-agent: Secondary | ICD-10-CM

## 2023-10-19 ENCOUNTER — Other Ambulatory Visit

## 2023-10-19 ENCOUNTER — Other Ambulatory Visit: Payer: Self-pay

## 2023-11-02 ENCOUNTER — Other Ambulatory Visit: Payer: Self-pay | Admitting: Internal Medicine

## 2023-11-02 ENCOUNTER — Other Ambulatory Visit (HOSPITAL_COMMUNITY): Payer: Self-pay

## 2023-11-02 DIAGNOSIS — B181 Chronic viral hepatitis B without delta-agent: Secondary | ICD-10-CM

## 2023-11-03 NOTE — Telephone Encounter (Signed)
Appointment 5/29

## 2023-11-07 ENCOUNTER — Other Ambulatory Visit: Payer: Self-pay

## 2023-11-07 ENCOUNTER — Ambulatory Visit: Payer: Commercial Managed Care - PPO | Admitting: Internal Medicine

## 2023-11-07 MED ORDER — VEMLIDY 25 MG PO TABS
25.0000 mg | ORAL_TABLET | Freq: Every day | ORAL | 0 refills | Status: DC
  Filled 2023-11-07 – 2023-11-08 (×2): qty 30, 30d supply, fill #0

## 2023-11-08 ENCOUNTER — Other Ambulatory Visit (HOSPITAL_COMMUNITY): Payer: Self-pay

## 2023-11-09 ENCOUNTER — Other Ambulatory Visit (HOSPITAL_COMMUNITY): Payer: Self-pay

## 2023-11-09 ENCOUNTER — Other Ambulatory Visit: Payer: Self-pay

## 2023-11-09 ENCOUNTER — Encounter: Payer: Self-pay | Admitting: Internal Medicine

## 2023-11-09 ENCOUNTER — Ambulatory Visit: Admitting: Internal Medicine

## 2023-11-09 VITALS — BP 125/77 | HR 71 | Temp 97.8°F | Ht 68.0 in | Wt 194.0 lb

## 2023-11-09 DIAGNOSIS — B181 Chronic viral hepatitis B without delta-agent: Secondary | ICD-10-CM

## 2023-11-09 MED ORDER — FERROUS SULFATE 325 (65 FE) MG PO TABS
325.0000 mg | ORAL_TABLET | Freq: Every day | ORAL | 0 refills | Status: AC
  Filled 2023-11-09: qty 90, 90d supply, fill #0

## 2023-11-09 MED ORDER — VEMLIDY 25 MG PO TABS
25.0000 mg | ORAL_TABLET | Freq: Every day | ORAL | 5 refills | Status: DC
  Filled 2023-11-09 – 2023-11-21 (×2): qty 30, 30d supply, fill #0
  Filled 2023-12-18 – 2024-01-04 (×2): qty 30, 30d supply, fill #1
  Filled 2024-01-26 – 2024-02-23 (×2): qty 30, 30d supply, fill #2
  Filled 2024-03-20: qty 30, 30d supply, fill #3
  Filled 2024-04-16 – 2024-05-27 (×2): qty 30, 30d supply, fill #4
  Filled 2024-06-20: qty 30, 30d supply, fill #5

## 2023-11-09 NOTE — Progress Notes (Unsigned)
 Patient Active Problem List   Diagnosis Date Noted   Medication monitoring encounter 05/04/2023   Iron  deficiency anemia due to chronic blood loss 12/22/2022   Chronic hepatitis B without delta agent without cirrhosis (HCC) 05/10/2016   Latent tuberculosis by blood test 05/10/2016   Seasonal allergies 05/10/2016   Microcytic anemia 05/10/2016   Thrombocytopenia (HCC) 05/10/2016    Patient's Medications  New Prescriptions   No medications on file  Previous Medications   FERROUS SULFATE  (FERROUSUL) 325 (65 FE) MG TABLET    Take 1 tablet (325 mg total) by mouth daily with breakfast. Take daily with a source of Vitamin C for improved absorption.   FERROUS SULFATE  325 (65 FE) MG TABLET    Take 1 tablet (325 mg total) by mouth daily.   HYDROCORTISONE  (ANUSOL -HC) 2.5 % RECTAL CREAM    Place rectally 2 times daily. For 1 week   HYDROXYZINE  (ATARAX ) 25 MG TABLET    Take 1-2 tablets (25-50 mg total) by mouth at bedtime as needed.   IBUPROFEN (ADVIL,MOTRIN) 400 MG TABLET    Take 400 mg by mouth.   MELOXICAM  (MOBIC ) 15 MG TABLET    Take 1 tablet by mouth daily.   METFORMIN  (GLUCOPHAGE ) 850 MG TABLET    Take 1 tablet (850 mg total) by mouth daily after supper for 14 days, THEN 1 tablet (850 mg total) 2 (two) times daily with a meal.   NA SULFATE-K SULFATE-MG SULF (SUPREP BOWEL PREP KIT) 17.5-3.13-1.6 GM/177ML SOLN    Take as directed per package instructions for 1 dose.   ONDANSETRON  (ZOFRAN -ODT) 4 MG DISINTEGRATING TABLET    Dissolve 1 tablet (4 mg total) by mouth every 8 (eight) hours as needed.   PHENTERMINE  (ADIPEX-P ) 37.5 MG TABLET    Take 37.5 mg by mouth every morning.   PHENTERMINE  (ADIPEX-P ) 37.5 MG TABLET    Take 1 tablet (37.5 mg total) by mouth daily.   SEMAGLUTIDE -WEIGHT MANAGEMENT (WEGOVY ) 0.25 MG/0.5ML SOAJ    Inject 0.25 mg into the skin every 7 (seven) days.   SEMAGLUTIDE -WEIGHT MANAGEMENT (WEGOVY ) 0.5 MG/0.5ML SOAJ    Inject 0.5 mg into the skin every 7 (seven) days.    TENOFOVIR  ALAFENAMIDE (VEMLIDY ) 25 MG TABLET    Take 1 tablet (25 mg total) by mouth daily.   TOPIRAMATE  (TOPAMAX ) 25 MG TABLET    Take 1 tablet (25 mg total) by mouth 2 (two) times daily.   TOPIRAMATE  (TOPAMAX ) 25 MG TABLET    Take 2 tablets (50 mg total) by mouth daily.   TRAZODONE  (DESYREL ) 50 MG TABLET    Take 1 tablet (50 mg total) by mouth Nightly.   VITAMIN D , ERGOCALCIFEROL , (DRISDOL ) 1.25 MG (50000 UNIT) CAPS CAPSULE    Take 50,000 Units by mouth once a week.   VITAMIN D , ERGOCALCIFEROL , (DRISDOL ) 1.25 MG (50000 UNIT) CAPS CAPSULE    Take 1 capsule (50,000 Units total) by mouth once a week.   VITAMIN D , ERGOCALCIFEROL , (DRISDOL ) 1.25 MG (50000 UNIT) CAPS CAPSULE    Take 1 capsule (50,000 Units total) by mouth once a week.  Modified Medications   No medications on file  Discontinued Medications   No medications on file    Subjective: 48 year old female with hepatitis B managed with Vemlidy  presents follow-up with Dr. Theora Flair presents for follow-up.  No new complaints today.  She is followed by PCP for fatty liver disease, not on statin currently but is chosen to make lifestyle modifications. Social  history, drinks about a glass of wine once a week. Review of Systems: Review of Systems  All other systems reviewed and are negative.   Past Medical History:  Diagnosis Date   Anemia    Arthritis    Family history of adverse reaction to anesthesia    Pt Cousin was confused and a little agitated   Gallstones    Heart murmur    mild heart murmur in past   Hepatitis    Hep B   TB lung, latent     Social History   Tobacco Use   Smoking status: Never   Smokeless tobacco: Never  Substance Use Topics   Alcohol use: Yes    Alcohol/week: 1.0 standard drink of alcohol    Types: 1 Standard drinks or equivalent per week    Comment: rare social drinker   Drug use: No    Family History  Problem Relation Age of Onset   Hypertension Mother    Arthritis Mother    Hepatitis B  Mother     Allergies  Allergen Reactions   Prednisone     Other reaction(s): Other (See Comments) Causes a flare in Hep. B    Health Maintenance  Topic Date Due   HIV Screening  Never done   Pneumococcal Vaccine 28-73 Years old (1 of 2 - PCV) Never done   Cervical Cancer Screening (HPV/Pap Cotest)  Never done   COVID-19 Vaccine (6 - 2024-25 season) 02/12/2023   INFLUENZA VACCINE  01/12/2024   DTaP/Tdap/Td (2 - Td or Tdap) 06/22/2026   Colonoscopy  06/09/2032   Hepatitis C Screening  Completed   HPV VACCINES  Aged Out   Meningococcal B Vaccine  Aged Out    Objective:  Vitals:   11/09/23 1005  BP: 125/77  Pulse: 71  Temp: 97.8 F (36.6 C)  TempSrc: Temporal  SpO2: 99%  Weight: 194 lb (88 kg)  Height: 5\' 8"  (1.727 m)   Body mass index is 29.5 kg/m.  Physical Exam Constitutional:      Appearance: Normal appearance.  HENT:     Head: Normocephalic and atraumatic.     Right Ear: Tympanic membrane normal.     Left Ear: Tympanic membrane normal.     Nose: Nose normal.     Mouth/Throat:     Mouth: Mucous membranes are moist.  Eyes:     Extraocular Movements: Extraocular movements intact.     Conjunctiva/sclera: Conjunctivae normal.     Pupils: Pupils are equal, round, and reactive to light.  Cardiovascular:     Rate and Rhythm: Normal rate and regular rhythm.     Heart sounds: No murmur heard.    No friction rub. No gallop.  Pulmonary:     Effort: Pulmonary effort is normal.     Breath sounds: Normal breath sounds.  Abdominal:     General: Abdomen is flat.     Palpations: Abdomen is soft.  Musculoskeletal:        General: Normal range of motion.  Skin:    General: Skin is warm and dry.  Neurological:     General: No focal deficit present.     Mental Status: She is alert and oriented to person, place, and time.  Psychiatric:        Mood and Affect: Mood normal.    Physical Exam   Lab Results Lab Results  Component Value Date   WBC 6.2 06/04/2023    HGB 15.1 (H) 06/04/2023   HCT 46.0  06/04/2023   MCV 84.7 06/04/2023   PLT 139 (L) 06/04/2023    Lab Results  Component Value Date   CREATININE 0.85 06/04/2023   BUN 18 06/04/2023   NA 139 06/04/2023   K 4.0 06/04/2023   CL 103 06/04/2023   CO2 27 06/04/2023    Lab Results  Component Value Date   ALT 11 06/04/2023   AST 20 06/04/2023   GGT 17 09/16/2022   ALKPHOS 110 06/04/2023   BILITOT 0.5 06/04/2023    No results found for: "CHOL", "HDL", "LDLCALC", "LDLDIRECT", "TRIG", "CHOLHDL" No results found for: "LABRPR", "RPRTITER" No results found for: "HIV1RNAQUANT", "HIV1RNAVL", "CD4TABS"   Problem List Items Addressed This Visit   None  Results   Assessment/Plan #Chronic hepatitis B - On Vemlidy -continue - Will get labs today - Reviewed last set of labs with viral load nondetectable on 04/25/2023. E ag negative, E ab+ -Ultrasound 12/12 showed heterogenous liver, no cirrhosis noted - Follow-up in 6 months, will order ultrasound at next visit as well  #HLD -pt plans ot follow-up with pcp    Orlie Bjornstad, MD [facility] 11/09/2023, 10:14 AM   I have personally spent 41 minutes involved in face-to-face and non-face-to-face activities for this patient on the day of the visit. Professional time spent includes the following activities: Preparing to see the patient (review of tests), Obtaining and/or reviewing separately obtained history (admission/discharge record), Performing a medically appropriate examination and/or evaluation , Ordering medications/tests/procedures, referring and communicating with other health care professionals, Documenting clinical information in the EMR, Independently interpreting results (not separately reported), Communicating results to the patient/family/caregiver, Counseling and educating the patient/family/caregiver and Care coordination (not separately reported).

## 2023-11-10 ENCOUNTER — Other Ambulatory Visit: Payer: Self-pay

## 2023-11-10 ENCOUNTER — Other Ambulatory Visit (HOSPITAL_COMMUNITY): Payer: Self-pay

## 2023-11-10 MED ORDER — PHENTERMINE HCL 37.5 MG PO TABS
37.5000 mg | ORAL_TABLET | Freq: Every day | ORAL | 0 refills | Status: DC
  Filled 2023-11-10: qty 30, 30d supply, fill #0

## 2023-11-13 LAB — COMPLETE METABOLIC PANEL WITHOUT GFR
AG Ratio: 1.7 (calc) (ref 1.0–2.5)
ALT: 8 U/L (ref 6–29)
AST: 14 U/L (ref 10–35)
Albumin: 4.3 g/dL (ref 3.6–5.1)
Alkaline phosphatase (APISO): 107 U/L (ref 31–125)
BUN/Creatinine Ratio: 10 (calc) (ref 6–22)
BUN: 11 mg/dL (ref 7–25)
CO2: 26 mmol/L (ref 20–32)
Calcium: 9.2 mg/dL (ref 8.6–10.2)
Chloride: 107 mmol/L (ref 98–110)
Creat: 1.06 mg/dL — ABNORMAL HIGH (ref 0.50–0.99)
Globulin: 2.6 g/dL (ref 1.9–3.7)
Glucose, Bld: 83 mg/dL (ref 65–99)
Potassium: 4.6 mmol/L (ref 3.5–5.3)
Sodium: 139 mmol/L (ref 135–146)
Total Bilirubin: 0.3 mg/dL (ref 0.2–1.2)
Total Protein: 6.9 g/dL (ref 6.1–8.1)

## 2023-11-13 LAB — CBC WITH DIFFERENTIAL/PLATELET
Absolute Lymphocytes: 1217 {cells}/uL (ref 850–3900)
Absolute Monocytes: 320 {cells}/uL (ref 200–950)
Basophils Absolute: 39 {cells}/uL (ref 0–200)
Basophils Relative: 1 %
Eosinophils Absolute: 39 {cells}/uL (ref 15–500)
Eosinophils Relative: 1 %
HCT: 38 % (ref 35.0–45.0)
Hemoglobin: 12 g/dL (ref 11.7–15.5)
MCH: 27.6 pg (ref 27.0–33.0)
MCHC: 31.6 g/dL — ABNORMAL LOW (ref 32.0–36.0)
MCV: 87.6 fL (ref 80.0–100.0)
MPV: 12.4 fL (ref 7.5–12.5)
Monocytes Relative: 8.2 %
Neutro Abs: 2285 {cells}/uL (ref 1500–7800)
Neutrophils Relative %: 58.6 %
Platelets: 133 10*3/uL — ABNORMAL LOW (ref 140–400)
RBC: 4.34 10*6/uL (ref 3.80–5.10)
RDW: 13.2 % (ref 11.0–15.0)
Total Lymphocyte: 31.2 %
WBC: 3.9 10*3/uL (ref 3.8–10.8)

## 2023-11-13 LAB — HEPATITIS B DNA, ULTRAQUANTITATIVE, PCR
Hepatitis B DNA: NOT DETECTED [IU]/mL
Hepatitis B virus DNA: NOT DETECTED {Log_IU}/mL

## 2023-11-13 LAB — HEPATITIS B E ANTIGEN: Hep B E Ag: NONREACTIVE

## 2023-11-13 LAB — HEPATITIS B E ANTIBODY: Hep B E Ab: REACTIVE — AB

## 2023-11-21 ENCOUNTER — Other Ambulatory Visit (HOSPITAL_COMMUNITY): Payer: Self-pay

## 2023-11-21 ENCOUNTER — Other Ambulatory Visit: Payer: Self-pay

## 2023-11-21 NOTE — Progress Notes (Signed)
 Specialty Pharmacy Refill Coordination Note  Christine Mccarty is a 48 y.o. female contacted today regarding refills of specialty medication(s) Tenofovir  Alafenamide Fumarate (Vemlidy )   Patient requested Delivery   Delivery date: 11/23/23   Verified address: [address] [place] [address]   Medication will be filled on 11/22/23.

## 2023-12-18 ENCOUNTER — Other Ambulatory Visit (HOSPITAL_COMMUNITY): Payer: Self-pay

## 2023-12-20 ENCOUNTER — Other Ambulatory Visit: Payer: Self-pay

## 2024-01-02 ENCOUNTER — Other Ambulatory Visit (HOSPITAL_COMMUNITY): Payer: Self-pay

## 2024-01-04 ENCOUNTER — Other Ambulatory Visit: Payer: Self-pay

## 2024-01-04 NOTE — Progress Notes (Signed)
 Specialty Pharmacy Refill Coordination Note  Christine Mccarty is a 48 y.o. female contacted today regarding refills of specialty medication(s) Tenofovir  Alafenamide Fumarate (Vemlidy )   Patient requested [name] at [facility] [place] date: 01/04/24   Medication will be filled on 07.24.25.

## 2024-01-26 ENCOUNTER — Other Ambulatory Visit: Payer: Self-pay

## 2024-01-29 ENCOUNTER — Other Ambulatory Visit (HOSPITAL_COMMUNITY): Payer: Self-pay

## 2024-01-31 ENCOUNTER — Other Ambulatory Visit (HOSPITAL_COMMUNITY): Payer: Self-pay

## 2024-01-31 MED ORDER — PHENTERMINE HCL 37.5 MG PO TABS
37.5000 mg | ORAL_TABLET | Freq: Every day | ORAL | 2 refills | Status: DC
  Filled 2024-01-31: qty 30, 30d supply, fill #0

## 2024-02-01 ENCOUNTER — Other Ambulatory Visit: Payer: Self-pay

## 2024-02-01 ENCOUNTER — Other Ambulatory Visit (HOSPITAL_COMMUNITY): Payer: Self-pay

## 2024-02-01 MED ORDER — HYDROXYZINE HCL 25 MG PO TABS
25.0000 mg | ORAL_TABLET | Freq: Every evening | ORAL | 0 refills | Status: DC | PRN
  Filled 2024-02-01: qty 60, 30d supply, fill #0

## 2024-02-09 ENCOUNTER — Other Ambulatory Visit: Payer: Self-pay

## 2024-02-09 ENCOUNTER — Other Ambulatory Visit (HOSPITAL_COMMUNITY): Payer: Self-pay

## 2024-02-09 DIAGNOSIS — E611 Iron deficiency: Secondary | ICD-10-CM | POA: Diagnosis not present

## 2024-02-09 DIAGNOSIS — E559 Vitamin D deficiency, unspecified: Secondary | ICD-10-CM | POA: Diagnosis not present

## 2024-02-09 DIAGNOSIS — Z9189 Other specified personal risk factors, not elsewhere classified: Secondary | ICD-10-CM | POA: Diagnosis not present

## 2024-02-09 DIAGNOSIS — Z6831 Body mass index (BMI) 31.0-31.9, adult: Secondary | ICD-10-CM | POA: Diagnosis not present

## 2024-02-09 DIAGNOSIS — E66811 Obesity, class 1: Secondary | ICD-10-CM | POA: Diagnosis not present

## 2024-02-09 DIAGNOSIS — Z131 Encounter for screening for diabetes mellitus: Secondary | ICD-10-CM | POA: Diagnosis not present

## 2024-02-09 DIAGNOSIS — D61818 Other pancytopenia: Secondary | ICD-10-CM | POA: Diagnosis not present

## 2024-02-09 MED ORDER — ZEPBOUND 2.5 MG/0.5ML ~~LOC~~ SOAJ
2.5000 mg | SUBCUTANEOUS | 0 refills | Status: AC
  Filled 2024-02-09 – 2024-02-23 (×3): qty 2, 28d supply, fill #0

## 2024-02-23 ENCOUNTER — Other Ambulatory Visit: Payer: Self-pay

## 2024-02-23 ENCOUNTER — Other Ambulatory Visit (HOSPITAL_COMMUNITY): Payer: Self-pay

## 2024-02-23 NOTE — Progress Notes (Signed)
 Specialty Pharmacy Refill Coordination Note  Christine Mccarty is a 48 y.o. female contacted today regarding refills of specialty medication(s) Tenofovir  Alafenamide Fumarate (Vemlidy )   Patient requested Pickup at [facility] at [place] date: 02/23/24   Medication will be filled on 09.12.25.

## 2024-02-24 ENCOUNTER — Other Ambulatory Visit (HOSPITAL_COMMUNITY): Payer: Self-pay

## 2024-03-18 ENCOUNTER — Other Ambulatory Visit (HOSPITAL_COMMUNITY): Payer: Self-pay

## 2024-03-20 ENCOUNTER — Other Ambulatory Visit: Payer: Self-pay

## 2024-03-22 ENCOUNTER — Other Ambulatory Visit: Payer: Self-pay

## 2024-03-22 NOTE — Progress Notes (Signed)
 Specialty Pharmacy Refill Coordination Note  Christine Mccarty is a 48 y.o. female contacted today regarding refills of specialty medication(s) Tenofovir  Alafenamide Fumarate (Vemlidy )   Patient requested Delivery   Delivery date: 03/25/24   Verified address: [address] [place] [address]   Medication will be filled on 10.10.25.

## 2024-03-26 ENCOUNTER — Other Ambulatory Visit: Payer: Self-pay

## 2024-03-26 NOTE — Progress Notes (Signed)
 Specialty Pharmacy Ongoing Clinical Assessment Note  Christine Mccarty is a 48 y.o. female who is being followed by the specialty pharmacy service for RxSp Hepatitis B   Patient's specialty medication(s) reviewed today: Tenofovir  Alafenamide Fumarate (Vemlidy )   Missed doses in the last 4 weeks: 0   Patient/Caregiver did not have any additional questions or concerns.   Therapeutic benefit summary: Patient is achieving benefit   Adverse events/side effects summary: No adverse events/side effects   Patient's therapy is appropriate to: Continue    Goals Addressed             This Visit's Progress    Achieve sustained HBV viral load suppression   On track    Patient is on track. Patient will maintain adherence. Patient remains undetectable long term         Follow up: 12 months  [facility]

## 2024-03-28 ENCOUNTER — Other Ambulatory Visit (HOSPITAL_COMMUNITY): Payer: Self-pay

## 2024-03-29 ENCOUNTER — Other Ambulatory Visit (HOSPITAL_COMMUNITY): Payer: Self-pay

## 2024-03-29 MED ORDER — AMOXICILLIN 500 MG PO CAPS: 500.0000 mg | ORAL_CAPSULE | Freq: Three times a day (TID) | ORAL | 0 refills | Status: AC

## 2024-03-29 MED ORDER — AMOXICILLIN 500 MG PO CAPS
ORAL_CAPSULE | ORAL | 1 refills | Status: AC
  Filled 2024-03-29: qty 30, 10d supply, fill #0
  Filled 2024-04-11 (×2): qty 30, 10d supply, fill #1

## 2024-04-02 ENCOUNTER — Other Ambulatory Visit (HOSPITAL_COMMUNITY): Payer: Self-pay

## 2024-04-02 ENCOUNTER — Other Ambulatory Visit: Payer: Self-pay

## 2024-04-02 MED ORDER — ATOVAQUONE-PROGUANIL HCL 250-100 MG PO TABS
ORAL_TABLET | ORAL | 0 refills | Status: AC
  Filled 2024-04-02: qty 30, 30d supply, fill #0

## 2024-04-02 MED ORDER — AZITHROMYCIN 500 MG PO TABS
ORAL_TABLET | ORAL | 0 refills | Status: AC
  Filled 2024-04-02: qty 4, 3d supply, fill #0

## 2024-04-05 ENCOUNTER — Other Ambulatory Visit (HOSPITAL_COMMUNITY): Payer: Self-pay

## 2024-04-05 MED ORDER — TOPIRAMATE 50 MG PO TABS
50.0000 mg | ORAL_TABLET | Freq: Two times a day (BID) | ORAL | 3 refills | Status: AC
  Filled 2024-04-05 – 2024-06-07 (×2): qty 180, 90d supply, fill #0

## 2024-04-05 MED ORDER — PHENTERMINE HCL 37.5 MG PO CAPS
37.5000 mg | ORAL_CAPSULE | Freq: Every day | ORAL | 2 refills | Status: AC
  Filled 2024-04-05 – 2024-04-25 (×2): qty 30, 30d supply, fill #0
  Filled 2024-06-07: qty 30, 30d supply, fill #1

## 2024-04-11 ENCOUNTER — Other Ambulatory Visit (HOSPITAL_COMMUNITY): Payer: Self-pay

## 2024-04-12 ENCOUNTER — Other Ambulatory Visit (HOSPITAL_COMMUNITY): Payer: Self-pay

## 2024-04-16 ENCOUNTER — Other Ambulatory Visit (HOSPITAL_COMMUNITY): Payer: Self-pay

## 2024-04-18 ENCOUNTER — Other Ambulatory Visit: Payer: Self-pay

## 2024-04-22 ENCOUNTER — Other Ambulatory Visit: Payer: Self-pay

## 2024-04-25 ENCOUNTER — Other Ambulatory Visit (HOSPITAL_COMMUNITY): Payer: Self-pay

## 2024-05-27 ENCOUNTER — Other Ambulatory Visit: Payer: Self-pay | Admitting: Pharmacy Technician

## 2024-05-27 ENCOUNTER — Other Ambulatory Visit: Payer: Self-pay

## 2024-05-27 NOTE — Progress Notes (Signed)
 Specialty Pharmacy Refill Coordination Note  Gianella Chismar is a 48 y.o. female contacted today regarding refills of specialty medication(s) Tenofovir  Alafenamide Fumarate (Vemlidy )   Patient requested Delivery   Delivery date: 05/29/24   Verified address: [address]  [place]   Medication will be filled on: 05/27/24

## 2024-05-28 ENCOUNTER — Other Ambulatory Visit: Payer: Self-pay | Admitting: Pharmacist

## 2024-05-28 ENCOUNTER — Other Ambulatory Visit: Payer: Self-pay

## 2024-05-28 NOTE — Progress Notes (Signed)
 Clinical Intervention Note  Clinical Intervention Notes: Patient missed 10 doses due to travel. Has resumed medication and has remained adherent. Labs from 11/09/23 show non-reactive hep B antigen.   Clinical Intervention Outcomes: Improved therapy adherence   Lyle LELON Chalk Specialty Pharmacist

## 2024-06-07 ENCOUNTER — Other Ambulatory Visit: Payer: Self-pay

## 2024-06-07 ENCOUNTER — Other Ambulatory Visit (HOSPITAL_COMMUNITY): Payer: Self-pay

## 2024-06-07 MED ORDER — HYDROXYZINE HCL 25 MG PO TABS
25.0000 mg | ORAL_TABLET | Freq: Every evening | ORAL | 0 refills | Status: AC | PRN
  Filled 2024-06-07: qty 60, 30d supply, fill #0

## 2024-06-20 ENCOUNTER — Other Ambulatory Visit: Payer: Self-pay

## 2024-06-21 ENCOUNTER — Other Ambulatory Visit: Payer: Self-pay

## 2024-06-24 ENCOUNTER — Other Ambulatory Visit (HOSPITAL_COMMUNITY): Payer: Self-pay

## 2024-06-26 ENCOUNTER — Other Ambulatory Visit: Payer: Self-pay | Admitting: Pharmacy Technician

## 2024-06-26 ENCOUNTER — Other Ambulatory Visit: Payer: Self-pay

## 2024-06-26 NOTE — Progress Notes (Signed)
 Specialty Pharmacy Refill Coordination Note  Christine Mccarty is a 49 y.o. female contacted today regarding refills of specialty medication(s) Tenofovir  Alafenamide Fumarate (Vemlidy )   Patient requested Delivery   Delivery date: 06/28/24   Verified address: [address]  [place]   Medication will be filled on: 06/27/24

## 2024-06-27 ENCOUNTER — Other Ambulatory Visit: Payer: Self-pay

## 2024-07-19 ENCOUNTER — Other Ambulatory Visit: Payer: Self-pay

## 2024-07-19 ENCOUNTER — Other Ambulatory Visit (HOSPITAL_COMMUNITY): Payer: Self-pay

## 2024-07-19 ENCOUNTER — Other Ambulatory Visit: Payer: Self-pay | Admitting: Internal Medicine

## 2024-07-19 DIAGNOSIS — B181 Chronic viral hepatitis B without delta-agent: Secondary | ICD-10-CM

## 2024-07-19 MED ORDER — VEMLIDY 25 MG PO TABS
25.0000 mg | ORAL_TABLET | Freq: Every day | ORAL | 1 refills | Status: AC
  Filled 2024-07-19: qty 30, 30d supply, fill #0
